# Patient Record
Sex: Male | Born: 1935 | Race: White | Hispanic: No | Marital: Married | Smoking: Never smoker
Health system: Southern US, Community
[De-identification: ages and names within clinical notes are randomized; demographics above are authoritative.]

## PROBLEM LIST (undated history)

## (undated) DIAGNOSIS — E119 Type 2 diabetes mellitus without complications: Secondary | ICD-10-CM

## (undated) DIAGNOSIS — N200 Calculus of kidney: Secondary | ICD-10-CM

## (undated) HISTORY — PX: TONSILLECTOMY: SUR1361

## (undated) HISTORY — DX: Calculus of kidney: N20.0

---

## 2004-03-24 ENCOUNTER — Emergency Department (HOSPITAL_COMMUNITY): Admission: EM | Admit: 2004-03-24 | Discharge: 2004-03-24 | Payer: Self-pay | Admitting: Emergency Medicine

## 2005-08-06 ENCOUNTER — Emergency Department (HOSPITAL_COMMUNITY): Admission: EM | Admit: 2005-08-06 | Discharge: 2005-08-06 | Payer: Self-pay | Admitting: Emergency Medicine

## 2005-12-07 ENCOUNTER — Inpatient Hospital Stay (HOSPITAL_COMMUNITY): Admission: EM | Admit: 2005-12-07 | Discharge: 2005-12-11 | Payer: Self-pay | Admitting: Emergency Medicine

## 2009-07-19 ENCOUNTER — Emergency Department (HOSPITAL_COMMUNITY): Admission: EM | Admit: 2009-07-19 | Discharge: 2009-07-19 | Payer: Self-pay | Admitting: Emergency Medicine

## 2009-07-21 ENCOUNTER — Emergency Department (HOSPITAL_COMMUNITY): Admission: EM | Admit: 2009-07-21 | Discharge: 2009-07-21 | Payer: Self-pay | Admitting: Emergency Medicine

## 2009-08-03 ENCOUNTER — Emergency Department (HOSPITAL_COMMUNITY): Admission: EM | Admit: 2009-08-03 | Discharge: 2009-08-04 | Payer: Self-pay | Admitting: Emergency Medicine

## 2009-08-03 ENCOUNTER — Emergency Department (HOSPITAL_COMMUNITY): Admission: EM | Admit: 2009-08-03 | Discharge: 2009-08-03 | Payer: Self-pay | Admitting: Family Medicine

## 2010-06-10 NOTE — Discharge Summary (Signed)
Isaac Haynes, Isaac Haynes             ACCOUNT NO.:  [id number]   MEDICAL RECORD NO.:  [id number]          PATIENT TYPE:  INP   LOCATION:  5532                         FACILITY:  [facility]   PHYSICIAN:  Corinna L. Lendell Caprice, MDDATE OF BIRTH:  1935-07-03   DATE OF ADMISSION:  12/07/2005  DATE OF DISCHARGE:  12/11/2005                                 DISCHARGE SUMMARY   DISCHARGE DIAGNOSES:  1. Syncope secondary to hypotension and dehydration.  2. Enteritis, suspect infectious.  3. Hypotension, resolved.  4. History of hypertension.  5. Bladder outlet obstruction/benign prostatic hypertrophy.  6. Acute renal insufficiency, resolved.  7. Dehydration.   DISCHARGE MEDICATIONS:  1. Hold hydrochlorothiazide, lisinopril and Lopressor for a week.  2. Stop Cardura.  3. Start Flomax 0.4 mg nightly.  4. Cipro 500 mg p.o. b.i.d. for eight more doses.  5. Flagyl 500 mg p.o. t.i.d. for 12 more doses.   DIET:  No dairy for seven days.   ACTIVITY:  Ad lib.   CONDITION:  Stable.   FOLLOW UP:  With primary care physician next week.   PERTINENT LABORATORY DATA:  Initial white blood cell count was 12,000,  otherwise unremarkable CBC.  Initial sodium 132, glucose 158, BUN 37,  creatinine 2.3, albumin 3.1 otherwise unremarkable CMET.  At discharge, his  white blood cell count of 10,000.  Discharge BUN is 16.  Discharge glucose  is 129, creatinine 1.0.  Initial CPK is 620, normal troponins, normal index.  Hemoglobin A1c 6.3, lipase normal.  Myoglobin greater than 500.  UA showed  small bilirubin, negative ketones, negative blood, negative nitrite,  negative leukocyte esterase.   SPECIAL STUDIES IN RADIOLOGY:  Acute abdominal series negative.  CT of the  abdomen and pelvis showed bilateral nephrolithiasis without obstruction.  Ill-defined cystic lesion in the left kidney most likely representing simple  cyst, but incompletely evaluated.  Inflammatory changes within the small  bowel mesentery and a  small amount of free fluid most compatible with  nonspecific enteritis.  Diverticular changes within the sigmoid colon  without any evidence of diverticulitis.  Renal ultrasound showed left renal  cyst, bilateral renal stones.   HISTORY AND HOSPITAL COURSE:  Isaac Haynes is a 75 year old prisoner who had a  syncopal episode and was found to be hypotensive.  His blood pressure in the  emergency room apparently was about 90 systolic; otherwise normal vital  signs.  He had dry mucous membranes.  He had been experiencing profuse  diarrhea, abdominal pain, vomiting, weakness.  After admission, he did drop  his blood pressure to 71.  He got IV fluids, started on empiric antibiotics.  CAT scan showed an enteritis which was presumed to be infectious.  His blood  pressure medications were held.  He was found to have renal insufficiency  which resolved with IV fluids and holding his blood pressure medications.  At the time of discharge, he had normal vital signs.  He had no change with  orthostatics.  He was tolerating a regular diet without any further diarrhea  or weakness.  He did have an episode of difficulty voiding and required a  Foley catheter for a short time.  Currently, he is on Flomax instead of  Cardura and is experiencing no problems voiding.  I would recommend holding  his antihypertensives for a week and continuing his antibiotics for four  more days.      Corinna L. Lendell Caprice, MD  Electronically Signed     CLS/MEDQ  D:  12/11/2005  T:  12/12/2005  Job:  [id number]

## 2010-06-10 NOTE — H&P (Signed)
Isaac Haynes, Isaac Haynes                ACCOUNT NO.:  [id number]   MEDICAL RECORD NO.:  [id number]          PATIENT TYPE:  INP   LOCATION:  1824                         FACILITY:  [facility]   PHYSICIAN:  Hollice Espy, M.D.DATE OF BIRTH:  April 09, 1935   DATE OF ADMISSION:  12/07/2005  DATE OF DISCHARGE:                                HISTORY & PHYSICAL   PRIMARY CARE PHYSICIAN:  None.  Of note, the patient is a prisoner in the  penial system.   CHIEF COMPLAINT:  Syncope.   HISTORY OF THE PRESENT ILLNESS:  The patient is a 75 year old incarcerated  white male with a past medical history of hypertension and BPH who tells me  that for the last few days he has been feeling rough where has been having  episodes of abdominal pain as well as nausea, vomiting and diarrhea, and  then today he felt very weak and he states that he passed out for what he  thought was several hours.  When he was discovered by the guards he was  brought into the emergency room for further evaluation.   In the emergency room he was noted to be hypotensive with a systolic blood  pressure ranging anywhere from 89 up to 99.  Labs of note were a white count  of 12.3. CPK level was greater than 500 with normal MB and troponin, and  elevated BUN and of 38 with a creatinine of 2.2, and a glucose of 158.  After finding these abnormal lab values and with the patient continuing to  remain hypotensive despite multiple doses of IV fluids it was felt best that  the patient come in for further observation.   Currently the patient is doing okay.  He denies any headaches, vision  changes, dysphagia, chest pain, palpitations, shortness of breath, wheezing,  or coughing.  When asked about his abdominal pain he describes it as  starting in the right lower quadrant, but going up to the right upper  quadrant and across his abdomen to the left upper quadrant.  He denies any  constipation.  He states his diarrhea has eased off a little and  he is  hungry.  He denies any focal extremity numbness, weakness or pain.   REVIEW OF SYSTEMS:  The review of systems is otherwise negative.   PAST MEDICAL HISTORY:  The patient's past medical history includes:  1. Hypertension.  2. BPH.   MEDICATIONS:  The medications the patient is on are:  1. Cardura 2 mg by mouth daily.  2. Lisinopril 20 by mouth daily.  3. Hydrochlorothiazide 25 by mouth daily.  4. The patient states he is on a prostate medication; I do not see one      listed.   ALLERGIES:  The patient has no known drug allergies.   SOCIAL HISTORY:  The patient used to smoke heavily, but quit a  few years  ago.  He denies any drug or alcohol abuse.  Currently he is incarcerated.   FAMILY HISTORY:  The family history is noncontributory.   PHYSICAL EXAMINATION:  VITAL SIGNS:  The patient's  vital signs on admission  are temp 97.1, heart rate 96, blood pressure 99/47, it has not improved much  and is still 93/43, respirations 28, and O2 sat  is 98% on room air.  GENERAL APPEARANCE:  In general to is alert and oriented times three, and in  no apparent distress.  HEENT:  Normocephalic and atraumatic.  His mucous membranes are dry.  NECK:  The patient has no carotid bruits.  HEART:  The heart has a regular rate and rhythm, S1 and S2.  LUNGS:  The lungs are clear to auscultation bilaterally.  ABDOMEN:  The abdomen is soft.  There is some mild tenderness, but otherwise  it is nondistended.  Positive bowel sounds.  EXTREMITIES:  The extremities show no clubbing, cyanosis or edema.   LABORATORY DATA:  The UA shows just a small amount bilirubin, otherwise  negative.  Sodium 132, potassium 4.5, chloride 99, bicarb 23, no gap, BUN  37, creatinine 2.3, and glucose 158.  Albumin is noted to be 3.1.  The rest  of his liver function tests are essentially normal.  Lipase 43, normal.  His  CPK is greater than 500 and MB normal at 4.2.  Troponin less than 0.05.  The  second set is  similar.   ASSESSMENT AND PLAN:  1. Syncope.  This looks to be secondary to a case of orthostatic      hypotension perhaps brought on by nausea, vomiting and diarrhea.  We will hold the patient's antihypertensive medication and aggressively  hydrate.  1. Abdominal pain.  The patient's description of from his right lower      quadrant to right upper to left upper quadrant possibly spans the      region of his colon, and we would like to check a computerized      tomography of the abdomen and the pelvis.  We will wait until his renal function has resumed back to normal.  In the  meantime we will continue to follow. If he does, indeed, have a colitis,  this may explain the nausea and vomiting rather than a simple viral  gastroenteritis.  We will continue to follow his white count.  If it still  remains elevated tomorrow then would consider starting the patient on  antibiotics.  1. Acute renal failure.  This is secondary to dehydration.  We will aggressively hydrate the patient.  1. Hypertension.  We hold the patient's antihypertensive medications.  1. Slightly elevated blood sugars.  We will check a hemoglobin A-1-C to be sure the patient does not also have  the diagnosis of diabetes.  1. Elevated CPK level.  He also may have a rhabdomyolysis component from      his passing out episode.  Again, we will more aggressively hydrate him and follow CPK levels.  1. Hyponatremia; likely from dehydration.      Hollice Espy, M.D.  Electronically Signed     SKK/MEDQ  D:  12/07/2005  T:  12/07/2005  Job:  [id number]
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
## 2013-01-18 ENCOUNTER — Emergency Department (HOSPITAL_COMMUNITY): Payer: Medicare Other

## 2013-01-18 ENCOUNTER — Encounter (HOSPITAL_COMMUNITY): Payer: Self-pay | Admitting: Emergency Medicine

## 2013-01-18 ENCOUNTER — Observation Stay (HOSPITAL_COMMUNITY)
Admission: EM | Admit: 2013-01-18 | Discharge: 2013-01-21 | Disposition: A | Discharge status: Home / Self Care | Payer: Medicare Other | Attending: Internal Medicine | Admitting: Internal Medicine

## 2013-01-18 DIAGNOSIS — J440 Chronic obstructive pulmonary disease with acute lower respiratory infection: Secondary | ICD-10-CM | POA: Insufficient documentation

## 2013-01-18 DIAGNOSIS — G319 Degenerative disease of nervous system, unspecified: Secondary | ICD-10-CM | POA: Insufficient documentation

## 2013-01-18 DIAGNOSIS — I498 Other specified cardiac arrhythmias: Secondary | ICD-10-CM | POA: Insufficient documentation

## 2013-01-18 DIAGNOSIS — Z9181 History of falling: Secondary | ICD-10-CM | POA: Insufficient documentation

## 2013-01-18 DIAGNOSIS — M5380 Other specified dorsopathies, site unspecified: Secondary | ICD-10-CM | POA: Insufficient documentation

## 2013-01-18 DIAGNOSIS — J209 Acute bronchitis, unspecified: Secondary | ICD-10-CM | POA: Insufficient documentation

## 2013-01-18 DIAGNOSIS — J9620 Acute and chronic respiratory failure, unspecified whether with hypoxia or hypercapnia: Principal | ICD-10-CM | POA: Insufficient documentation

## 2013-01-18 DIAGNOSIS — E119 Type 2 diabetes mellitus without complications: Secondary | ICD-10-CM | POA: Insufficient documentation

## 2013-01-18 LAB — POCT I-STAT TROPONIN I: Troponin i, poc: 0.01 ng/mL (ref 0.00–0.08)

## 2013-01-18 LAB — CBC WITH DIFFERENTIAL/PLATELET
Basophils Absolute: 0 10*3/uL (ref 0.0–0.1)
Basophils Relative: 1 % (ref 0–1)
Eosinophils Absolute: 0 10*3/uL (ref 0.0–0.7)
HCT: 39.1 % (ref 39.0–52.0)
Hemoglobin: 13.5 g/dL (ref 13.0–17.0)
MCH: 30.8 pg (ref 26.0–34.0)
MCHC: 34.5 g/dL (ref 30.0–36.0)
Monocytes Absolute: 1.1 10*3/uL — ABNORMAL HIGH (ref 0.1–1.0)
Monocytes Relative: 16 % — ABNORMAL HIGH (ref 3–12)
Neutrophils Relative %: 67 % (ref 43–77)
RDW: 12.8 % (ref 11.5–15.5)

## 2013-01-18 LAB — COMPREHENSIVE METABOLIC PANEL
AST: 96 U/L — ABNORMAL HIGH (ref 0–37)
Albumin: 3.6 g/dL (ref 3.5–5.2)
BUN: 14 mg/dL (ref 6–23)
Creatinine, Ser: 0.9 mg/dL (ref 0.50–1.35)
Total Protein: 7.1 g/dL (ref 6.0–8.3)

## 2013-01-18 LAB — GLUCOSE, CAPILLARY: Glucose-Capillary: 340 mg/dL — ABNORMAL HIGH (ref 70–99)

## 2013-01-18 MED ORDER — DEXTROSE 5 % IV SOLN
500.0000 mg | Freq: Once | INTRAVENOUS | Status: AC
  Administered 2013-01-19: 500 mg via INTRAVENOUS

## 2013-01-18 MED ORDER — DEXTROSE 5 % IV SOLN
1.0000 g | Freq: Once | INTRAVENOUS | Status: AC
  Administered 2013-01-18: 1 g via INTRAVENOUS
  Filled 2013-01-18: qty 10

## 2013-01-18 NOTE — ED Notes (Addendum)
Per EMS: Pt reports having a fever 100.7, non-productive cough for two weeks, hyperglycemia of 305, and weakness with an abnormal gait for one month. Pt reports having difficulty with his balance.  Pt denies history of hyperglycemia, however has not been medically evaluated in "years and years." Pt reports falling today after his "legs buckled" and not having the strength to get back up. EMS reports lung sounds are clear. Pt was given 1000 mg of tylenol in route.

## 2013-01-18 NOTE — ED Provider Notes (Signed)
CSN: [id number]     Arrival date & time 01/18/13  1900 History   First MD Initiated Contact with Patient 01/18/13 1926     Chief Complaint  Patient presents with  . Fever  . Weakness  . Hyperglycemia   (Consider location/radiation/quality/duration/timing/severity/associated sxs/prior Treatment) HPI Comments: Patient is 77 year old gentleman who has not received medical care for the past 6 years because "there was nothing wrong with me".  He presents to the ED after his neighbor went to see him today and he lost his balance and his "leg buckled" and he fell into the door frame striking his right forehead.  He reports no LOC, but states dizziness for the past month.  He states that his neighbor called 911 and they checked his blood sugar as well and noted it to be 305 which is new (though his son reports that he previously told him he was supposed to be taking medication for his blood sugars but was not).  He reports generalized weakness, without focal deficit, reports fever, chills, non-productive cough, and mild shortness of breath with moving around.  He denies chest pain or tightness, nausea, vomiting, abdominal pain, swelling in his legs, calf pain.  Patient is a 77 y.o. male presenting with fever, weakness, and hyperglycemia. The history is provided by the patient. No language interpreter was used.  Fever Max temp prior to arrival:  Unknown Temp source:  Subjective Severity:  Moderate Onset quality:  Gradual Duration:  2 days Timing:  Constant Progression:  Worsening Chronicity:  New Relieved by:  Nothing Worsened by:  Nothing tried Ineffective treatments:  None tried Associated symptoms: chills, congestion and cough   Associated symptoms: no chest pain, no confusion, no diarrhea, no dysuria, no ear pain, no headaches, no myalgias, no nausea, no rash, no rhinorrhea, no somnolence, no sore throat and no vomiting   Weakness Associated symptoms include chills, congestion, coughing, a  fever and weakness. Pertinent negatives include no chest pain, headaches, myalgias, nausea, rash, sore throat or vomiting.  Hyperglycemia Associated symptoms: fever   Associated symptoms: no chest pain, no confusion, no dysuria, no nausea and no vomiting     History reviewed. No pertinent past medical history. History reviewed. No pertinent past surgical history. No family history on file. History  Substance Use Topics  . Smoking status: Not on file  . Smokeless tobacco: Not on file  . Alcohol Use: Not on file    Review of Systems  Constitutional: Positive for fever and chills.  HENT: Positive for congestion. Negative for ear pain, rhinorrhea and sore throat.   Respiratory: Positive for cough.   Cardiovascular: Negative for chest pain.  Gastrointestinal: Negative for nausea, vomiting and diarrhea.  Genitourinary: Negative for dysuria.  Musculoskeletal: Negative for myalgias.  Skin: Negative for rash.  Neurological: Positive for weakness. Negative for headaches.  Psychiatric/Behavioral: Negative for confusion.  All other systems reviewed and are negative.    Allergies  Review of patient's allergies indicates no known allergies.  Home Medications   Current Outpatient Rx  Name  Route  Sig  Dispense  Refill  . Chlorphen-Pseudoephed-APAP (CORICIDIN D PO)   Oral   Take 2 tablets by mouth daily as needed (cold).          BP 143/68  Pulse 118  Temp(Src) 98.4 F (36.9 C) (Oral)  Resp 16  SpO2 94% Physical Exam  Nursing note and vitals reviewed. Constitutional: He is oriented to person, place, and time. He appears well-developed and well-nourished.  No distress.  HENT:  Head: Normocephalic and atraumatic. Head is without abrasion and without contusion.    Right Ear: External ear normal.  Left Ear: External ear normal.  Nose: Nose normal.  Mouth/Throat: Oropharynx is clear and moist. No oropharyngeal exudate.  Mild tenderness to palpation right forehead without  hematoma noted  Eyes: Conjunctivae are normal. Pupils are equal, round, and reactive to light. No scleral icterus.  Neck: Normal range of motion. Neck supple. No JVD present. No spinous process tenderness and no muscular tenderness present.  Cardiovascular: Normal rate, regular rhythm and normal heart sounds.  Exam reveals no gallop and no friction rub.   No murmur heard. Pulmonary/Chest: Effort normal and breath sounds normal. No respiratory distress. He has no wheezes. He has no rales. He exhibits no tenderness.  Abdominal: Soft. Bowel sounds are normal. He exhibits no distension. There is no tenderness. There is no rebound and no guarding.  Musculoskeletal: Normal range of motion. He exhibits no edema and no tenderness.  Lymphadenopathy:    He has no cervical adenopathy.  Neurological: He is alert and oriented to person, place, and time. He has normal reflexes. He exhibits normal muscle tone. Coordination normal.  Skin: Skin is warm and dry. No rash noted. No erythema. No pallor.  Psychiatric: He has a normal mood and affect. His behavior is normal. Judgment and thought content normal.    ED Course  Procedures (including critical care time) Labs Review Labs Reviewed  CBC WITH DIFFERENTIAL - Abnormal; Notable for the following:    Monocytes Relative 16 (*)    Monocytes Absolute 1.1 (*)    All other components within normal limits  COMPREHENSIVE METABOLIC PANEL - Abnormal; Notable for the following:    Sodium 132 (*)    Chloride 94 (*)    Glucose, Bld 349 (*)    AST 96 (*)    ALT 81 (*)    GFR calc non Af Amer 81 (*)    All other components within normal limits  GLUCOSE, CAPILLARY - Abnormal; Notable for the following:    Glucose-Capillary 340 (*)    All other components within normal limits  POCT I-STAT TROPONIN I   Imaging Review Dg Chest 2 View  01/18/2013   CLINICAL DATA:  Fever. Generalized weakness. Cough. Chest congestion. Hyperglycemia.  EXAM: CHEST  2 VIEW   COMPARISON:  Portable examination 12/07/2005.  FINDINGS: Cardiac silhouette upper normal in size to perhaps slightly enlarged but stable. Hilar and mediastinal contours otherwise unremarkable. Pulmonary vascularity normal. Prominent bronchovascular markings diffusely and mild to moderate central peribronchial thickening, more so than on the prior examination. Lungs otherwise clear. No localized airspace consolidation. No pleural effusions. No pneumothorax. Normal pulmonary vascularity. Degenerative changes involving the mid and lower thoracic spine.  IMPRESSION: Mild to moderate changes of acute bronchitis superimposed upon COPD without localized airspace pneumonia.   Electronically Signed   By: Hulan Saas M.D.   On: 01/18/2013 20:43   Ct Head Wo Contrast  01/18/2013   CLINICAL DATA:  Fever.  Weakness.  Hyperglycemia.  EXAM: CT HEAD WITHOUT CONTRAST  CT CERVICAL SPINE WITHOUT CONTRAST  TECHNIQUE: Multidetector CT imaging of the head and cervical spine was performed following the standard protocol without intravenous contrast. Multiplanar CT image reconstructions of the cervical spine were also generated.  COMPARISON:  08/03/2009  FINDINGS: CT HEAD FINDINGS  Ventricles are normal in configuration. There is ventricular and sulcal enlargement reflecting moderate atrophy. No parenchymal masses or mass effect. There is patchy  white matter hypoattenuation most consistent with mild to moderate chronic microvascular ischemic change. There are 2 small foci of hypoattenuation in the deep right parietal lobe white matter that are likely old lacunar infarcts. There is no evidence of a recent cortical infarct.  There are no extra-axial masses or abnormal fluid collections.  There is no intracranial hemorrhage.  The visualized sinuses and mastoid air cells are clear.  CT CERVICAL SPINE FINDINGS  No fracture. No spondylolisthesis. There is moderate loss of disk height at C4-C5, C5-C6 and C6-C7. There is endplate  spurring. Mild facet degenerative changes noted most prominently on the right at C4-C5. The bones are demineralized.  Thyroid gland is mildly heterogeneous consistent with multiple ill-defined nodules. There are carotid vascular calcifications. The soft tissues are otherwise unremarkable. The lung apices are clear.  IMPRESSION: HEAD CT:  No acute intracranial abnormalities.  CERVICAL CT:  No fracture or acute finding.   Electronically Signed   By: Amie Portland M.D.   On: 01/18/2013 20:58   Ct Cervical Spine Wo Contrast  01/18/2013   CLINICAL DATA:  Fever.  Weakness.  Hyperglycemia.  EXAM: CT HEAD WITHOUT CONTRAST  CT CERVICAL SPINE WITHOUT CONTRAST  TECHNIQUE: Multidetector CT imaging of the head and cervical spine was performed following the standard protocol without intravenous contrast. Multiplanar CT image reconstructions of the cervical spine were also generated.  COMPARISON:  08/03/2009  FINDINGS: CT HEAD FINDINGS  Ventricles are normal in configuration. There is ventricular and sulcal enlargement reflecting moderate atrophy. No parenchymal masses or mass effect. There is patchy white matter hypoattenuation most consistent with mild to moderate chronic microvascular ischemic change. There are 2 small foci of hypoattenuation in the deep right parietal lobe white matter that are likely old lacunar infarcts. There is no evidence of a recent cortical infarct.  There are no extra-axial masses or abnormal fluid collections.  There is no intracranial hemorrhage.  The visualized sinuses and mastoid air cells are clear.  CT CERVICAL SPINE FINDINGS  No fracture. No spondylolisthesis. There is moderate loss of disk height at C4-C5, C5-C6 and C6-C7. There is endplate spurring. Mild facet degenerative changes noted most prominently on the right at C4-C5. The bones are demineralized.  Thyroid gland is mildly heterogeneous consistent with multiple ill-defined nodules. There are carotid vascular calcifications. The soft  tissues are otherwise unremarkable. The lung apices are clear.  IMPRESSION: HEAD CT:  No acute intracranial abnormalities.  CERVICAL CT:  No fracture or acute finding.   Electronically Signed   By: Amie Portland M.D.   On: 01/18/2013 20:58    EKG Interpretation    Date/Time:  Saturday January 18 2013 20:23:28 EST Ventricular Rate:  103 PR Interval:  166 QRS Duration: 102 QT Interval:  332 QTC Calculation: 434 R Axis:   -29 Text Interpretation:  Sinus tachycardia Borderline left axis deviation Low voltage, precordial leads Abnormal R-wave progression, early transition Consider anterior infarct Confirmed by DOCHERTY  MD, MEGAN ([id number]) on 01/18/2013 8:32:41 PM            MDM  Hyperglycemia Bronchitis  Patient with history of medical non-compliance presents to the ED with his neighbor and later his sons who express concern regarding his recent falls, dizziness, hyperglycemia, fever, and cough.  CT unremarkable, CXR without focal infiltrate but showing bronchitis over COPD, though not really hypoxic does complain of shortness of breath.  Afebrile here but also noted to be tachycardic.  Also with hyperglycemia without an acidosis.  Plan to admit for  further evaluation, diabetic teaching and stabilization of blood sugar and respiratory function.  Clinically appears to be more CAP so I have placed on zithromax and rocephin.   Izola Price Marisue Humble, PA-C 01/19/13 0124

## 2013-01-19 ENCOUNTER — Observation Stay (HOSPITAL_COMMUNITY): Payer: Medicare Other

## 2013-01-19 ENCOUNTER — Encounter (HOSPITAL_COMMUNITY): Payer: Self-pay | Admitting: *Deleted

## 2013-01-19 DIAGNOSIS — J209 Acute bronchitis, unspecified: Secondary | ICD-10-CM | POA: Diagnosis present

## 2013-01-19 DIAGNOSIS — E119 Type 2 diabetes mellitus without complications: Secondary | ICD-10-CM | POA: Diagnosis present

## 2013-01-19 LAB — BASIC METABOLIC PANEL
CO2: 26 mEq/L (ref 19–32)
Calcium: 8.7 mg/dL (ref 8.4–10.5)
Chloride: 92 mEq/L — ABNORMAL LOW (ref 96–112)
Creatinine, Ser: 0.94 mg/dL (ref 0.50–1.35)
GFR calc Af Amer: >90 mL/min (ref >90)
GFR calc non Af Amer: 80 mL/min — ABNORMAL LOW (ref >90)
Glucose, Bld: 314 mg/dL — ABNORMAL HIGH (ref 70–99)
Sodium: 133 mEq/L — ABNORMAL LOW (ref 135–145)

## 2013-01-19 LAB — GLUCOSE, CAPILLARY
Glucose-Capillary: 298 mg/dL — ABNORMAL HIGH (ref 70–99)
Glucose-Capillary: 379 mg/dL — ABNORMAL HIGH (ref 70–99)

## 2013-01-19 LAB — CBC
Hemoglobin: 13 g/dL (ref 13.0–17.0)
MCH: 31.3 pg (ref 26.0–34.0)
MCHC: 34.8 g/dL (ref 30.0–36.0)
MCV: 89.9 fL (ref 78.0–100.0)
Platelets: 177 10*3/uL (ref 150–400)
RBC: 4.16 MIL/uL — ABNORMAL LOW (ref 4.22–5.81)
WBC: 5.9 10*3/uL (ref 4.0–10.5)

## 2013-01-19 LAB — HEMOGLOBIN A1C: Mean Plasma Glucose: 280 mg/dL — ABNORMAL HIGH (ref <117)

## 2013-01-19 MED ORDER — GUAIFENESIN-DM 100-10 MG/5ML PO SYRP
5.0000 mL | ORAL_SOLUTION | ORAL | Status: DC | PRN
  Administered 2013-01-19 – 2013-01-20 (×4): 5 mL via ORAL
  Filled 2013-01-19 (×4): qty 10

## 2013-01-19 MED ORDER — ALBUTEROL SULFATE (5 MG/ML) 0.5% IN NEBU: 2.5000 mg | INHALATION_SOLUTION | RESPIRATORY_TRACT | Status: DC | PRN

## 2013-01-19 MED ORDER — ALBUTEROL SULFATE (2.5 MG/3ML) 0.083% IN NEBU
2.5000 mg | INHALATION_SOLUTION | Freq: Four times a day (QID) | RESPIRATORY_TRACT | Status: DC
  Administered 2013-01-19 – 2013-01-21 (×5): 2.5 mg via RESPIRATORY_TRACT
  Filled 2013-01-19 (×2): qty 3
  Filled 2013-01-19: qty 0.5
  Filled 2013-01-19: qty 3
  Filled 2013-01-19: qty 0.5
  Filled 2013-01-19: qty 3
  Filled 2013-01-19: qty 0.5
  Filled 2013-01-19 (×4): qty 3

## 2013-01-19 MED ORDER — ALBUTEROL SULFATE (5 MG/ML) 0.5% IN NEBU
2.5000 mg | INHALATION_SOLUTION | Freq: Once | RESPIRATORY_TRACT | Status: AC
  Administered 2013-01-19: 2.5 mg via RESPIRATORY_TRACT
  Filled 2013-01-19: qty 0.5

## 2013-01-19 MED ORDER — ALBUTEROL SULFATE (2.5 MG/3ML) 0.083% IN NEBU
2.5000 mg | INHALATION_SOLUTION | RESPIRATORY_TRACT | Status: DC | PRN
  Filled 2013-01-19: qty 3

## 2013-01-19 MED ORDER — SODIUM CHLORIDE 0.9 % IV SOLN
INTRAVENOUS | Status: DC
  Administered 2013-01-19: 01:00:00 via INTRAVENOUS

## 2013-01-19 MED ORDER — INSULIN ASPART 100 UNIT/ML ~~LOC~~ SOLN
0.0000 [IU] | Freq: Three times a day (TID) | SUBCUTANEOUS | Status: DC
  Administered 2013-01-19: 20 [IU] via SUBCUTANEOUS
  Administered 2013-01-20: 11 [IU] via SUBCUTANEOUS
  Administered 2013-01-20 (×2): 15 [IU] via SUBCUTANEOUS
  Administered 2013-01-21: 11 [IU] via SUBCUTANEOUS
  Administered 2013-01-21: 8 [IU] via SUBCUTANEOUS

## 2013-01-19 MED ORDER — ACETAMINOPHEN 325 MG PO TABS
650.0000 mg | ORAL_TABLET | Freq: Four times a day (QID) | ORAL | Status: DC | PRN
  Administered 2013-01-19 – 2013-01-20 (×3): 650 mg via ORAL
  Filled 2013-01-19 (×3): qty 2

## 2013-01-19 MED ORDER — AZITHROMYCIN 500 MG PO TABS
500.0000 mg | ORAL_TABLET | Freq: Every day | ORAL | Status: DC
  Administered 2013-01-19 – 2013-01-21 (×3): 500 mg via ORAL
  Filled 2013-01-19 (×3): qty 1

## 2013-01-19 MED ORDER — INSULIN ASPART 100 UNIT/ML ~~LOC~~ SOLN
0.0000 [IU] | Freq: Three times a day (TID) | SUBCUTANEOUS | Status: DC
  Administered 2013-01-19: 15 [IU] via SUBCUTANEOUS
  Administered 2013-01-19: 8 [IU] via SUBCUTANEOUS

## 2013-01-19 MED ORDER — INSULIN GLARGINE 100 UNIT/ML ~~LOC~~ SOLN
15.0000 [IU] | Freq: Every day | SUBCUTANEOUS | Status: DC
  Filled 2013-01-19: qty 0.15

## 2013-01-19 MED ORDER — INSULIN GLARGINE 100 UNIT/ML ~~LOC~~ SOLN
5.0000 [IU] | Freq: Every day | SUBCUTANEOUS | Status: DC
  Administered 2013-01-19: 5 [IU] via SUBCUTANEOUS
  Filled 2013-01-19 (×2): qty 0.05

## 2013-01-19 MED ORDER — PREDNISONE 20 MG PO TABS
40.0000 mg | ORAL_TABLET | Freq: Every day | ORAL | Status: DC
  Administered 2013-01-19 – 2013-01-20 (×2): 40 mg via ORAL
  Filled 2013-01-19 (×3): qty 2

## 2013-01-19 MED ORDER — HEPARIN SODIUM (PORCINE) 5000 UNIT/ML IJ SOLN
5000.0000 [IU] | Freq: Three times a day (TID) | INTRAMUSCULAR | Status: DC
  Administered 2013-01-19 – 2013-01-21 (×7): 5000 [IU] via SUBCUTANEOUS
  Filled 2013-01-19 (×10): qty 1

## 2013-01-19 MED ORDER — INSULIN ASPART 100 UNIT/ML ~~LOC~~ SOLN
6.0000 [IU] | Freq: Three times a day (TID) | SUBCUTANEOUS | Status: DC
  Administered 2013-01-19 – 2013-01-21 (×6): 6 [IU] via SUBCUTANEOUS

## 2013-01-19 MED ORDER — INSULIN ASPART 100 UNIT/ML ~~LOC~~ SOLN
0.0000 [IU] | Freq: Every day | SUBCUTANEOUS | Status: DC
  Administered 2013-01-19: 4 [IU] via SUBCUTANEOUS
  Administered 2013-01-20: 5 [IU] via SUBCUTANEOUS

## 2013-01-19 NOTE — Evaluation (Signed)
Physical Therapy Evaluation Patient Details Name: Rafe Mackowski MRN: [id number] DOB: 02-06-1935 Today's Date: 01/19/2013 Time: 4098-1191 PT Time Calculation (min): 28 min  PT Assessment / Plan / Recommendation History of Present Illness  77 y.o. male who presents to the ED with generalized weakness and cough.  Symptoms onset 2 weeks ago and have gotten progressively worse.  Today he reports falling and inability to stand after falling.  No focal weakness just generalized weakness.  Mild SOB, cough, subjective fever.  Has not seen a doctor in 6 years, states no PMH but there is some question of a possible PMH of DM2 (per son "supposed to be taking a blood sugar pill")  Clinical Impression  Pt admitted with weakness after falling at home. Pt currently with functional limitations due to the deficits listed below (see PT Problem List). Pt will benefit from skilled PT to increase their independence and safety with mobility to allow discharge to the venue listed below. Also recommend OT eval as pt reports 5 falls in last 2 months, with 2 of those falls being in the shower.  Recommend RW and HHPT for home environment assessment.   PT Assessment  Patient needs continued PT services    Follow Up Recommendations  Home health PT    Does the patient have the potential to tolerate intense rehabilitation      Barriers to Discharge        Equipment Recommendations  Rolling walker with 5" wheels    Recommendations for Other Services OT consult   Frequency Min 3X/week    Precautions / Restrictions Precautions Precautions: Fall Precaution Comments: Pt reports 5 falls in last 2 months with 2 of them being out of the shower.   Pertinent Vitals/Pain o2 92% on room air with gait.      Mobility  Bed Mobility Bed Mobility: Supine to Sit Supine to Sit: HOB elevated;5: Supervision;With rails Transfers Transfers: Sit to Stand;Stand to Sit Sit to Stand: 4: Min guard;From bed;With upper extremity  assist Stand to Sit: 4: Min guard;To chair/3-in-1;With upper extremity assist Details for Transfer Assistance: cues for proper hand placement Ambulation/Gait Ambulation/Gait Assistance: 4: Min guard Ambulation Distance (Feet): 150 Feet Assistive device: Rolling walker Ambulation/Gait Assistance Details: amb with RW on room air with sats 92% and HR 103 Gait Pattern: Step-through pattern    Exercises     PT Diagnosis: Difficulty walking  PT Problem List: Decreased activity tolerance;Decreased mobility PT Treatment Interventions: Gait training;Stair training;Functional mobility training;Therapeutic activities;Therapeutic exercise;DME instruction     PT Goals(Current goals can be found in the care plan section) Acute Rehab PT Goals Patient Stated Goal: To not fall anymore PT Goal Formulation: With patient Time For Goal Achievement: 02/02/13 Potential to Achieve Goals: Good  Visit Information  Last PT Received On: 01/19/13 Assistance Needed: +1 History of Present Illness: 77 y.o. male who presents to the ED with generalized weakness and cough.  Symptoms onset 2 weeks ago and have gotten progressively worse.  Today he reports falling and inability to stand after falling.  No focal weakness just generalized weakness.  Mild SOB, cough, subjective fever.  Has not seen a doctor in 6 years, states no PMH but there is some question of a possible PMH of DM2 (per son "supposed to be taking a blood sugar pill")       Prior Functioning  Home Living Family/patient expects to be discharged to:: Private residence Living Arrangements: Alone Available Help at Discharge: Friend(s);Neighbor;Family Type of Home: Apartment Home Access: Stairs  to enter Entrance Stairs-Number of Steps: 4 (2 a sidewalk and then 2 more) Entrance Stairs-Rails: Right Home Layout: One level Home Equipment: Cane - single point Prior Function Level of Independence: Independent Communication Communication: No difficulties     Cognition  Cognition Arousal/Alertness: Awake/alert Behavior During Therapy: WFL for tasks assessed/performed Overall Cognitive Status: Within Functional Limits for tasks assessed    Extremity/Trunk Assessment Upper Extremity Assessment Upper Extremity Assessment: Overall WFL for tasks assessed Lower Extremity Assessment Lower Extremity Assessment: Overall WFL for tasks assessed Cervical / Trunk Assessment Cervical / Trunk Assessment: Normal   Balance    End of Session PT - End of Session Equipment Utilized During Treatment: Gait belt Activity Tolerance: Patient tolerated treatment well Patient left: in chair;with call bell/phone within reach Nurse Communication: Other (comment) (o2 sats with gait)  GP Functional Limitation: Mobility: Walking and moving around Mobility: Walking and Moving Around Current Status (J1914): At least 1 percent but less than 20 percent impaired, limited or restricted Mobility: Walking and Moving Around Goal Status [phone]): 0 percent impaired, limited or restricted   Sirius Woodford LUBECK 01/19/2013, 10:21 AM

## 2013-01-19 NOTE — ED Provider Notes (Signed)
Medical screening examination/treatment/procedure(s) were performed by non-physician practitioner and as supervising physician I was immediately available for consultation/collaboration.  EKG Interpretation    Date/Time:  Saturday January 18 2013 20:23:28 EST Ventricular Rate:  103 PR Interval:  166 QRS Duration: 102 QT Interval:  332 QTC Calculation: 434 R Axis:   -29 Text Interpretation:  Sinus tachycardia Borderline left axis deviation Low voltage, precordial leads Abnormal R-wave progression, early transition Consider anterior infarct Confirmed by Navie Lamoreaux  MD, Marciana Uplinger ([id number]) on 01/18/2013 8:32:41 PM              Shanna Cisco, MD 01/19/13 1229

## 2013-01-19 NOTE — Progress Notes (Signed)
Utilization Review completed.  

## 2013-01-19 NOTE — Progress Notes (Signed)
RT paged MD due to adult wheeze protocol order.  PT does not have any history of asthma.  RT explained this to MD on phone.  PT is here for Acute bronchitis per MD when MD responded to page.  MD stated that he would order treatments for patient.

## 2013-01-19 NOTE — H&P (Signed)
[facility] History and Physical  Jermanie Minshall AOZ:[id number] DOB: 1935/05/08 DOA: 01/18/2013  Referring physician: EDP PCP: Default, Provider, MD   Chief Complaint: Generalized weakness, cough   HPI: Isaac Haynes is a 77 y.o. male who presents to the ED with generalized weakness and cough.  Symptoms onset 2 weeks ago and have gotten progressively worse.  Today he reports falling and inability to stand after falling.  No focal weakness just generalized weakness.  Mild SOB, cough, subjective fever.  Has not seen a doctor in 6 years, states no PMH but there is some question of a possible PMH of DM2 (per son "supposed to be taking a blood sugar pill")  Review of Systems: Systems reviewed.  As above, otherwise negative  History reviewed. No pertinent past medical history. History reviewed. No pertinent past surgical history. Social History:  has no tobacco, alcohol, and drug history on file.  No Known Allergies  No family history on file.   Prior to Admission medications   Medication Sig Start Date End Date Taking? Authorizing Provider  Chlorphen-Pseudoephed-APAP (CORICIDIN D PO) Take 2 tablets by mouth daily as needed (cold).   Yes Historical Provider, MD   Physical Exam: Filed Vitals:   01/18/13 1903  BP: 143/68  Pulse: 118  Temp: 98.4 F (36.9 C)  Resp: 16    BP 143/68  Pulse 118  Temp(Src) 98.4 F (36.9 C) (Oral)  Resp 16  SpO2 94%  General Appearance:    Alert, oriented, no distress, appears stated age  Head:    Normocephalic, atraumatic  Eyes:    PERRL, EOMI, sclera non-icteric        Nose:   Nares without drainage or epistaxis. Mucosa, turbinates normal  Throat:   Moist mucous membranes. Oropharynx without erythema or exudate.  Neck:   Supple. No carotid bruits.  No thyromegaly.  No lymphadenopathy.   Back:     No CVA tenderness, no spinal tenderness  Lungs:     Wheezes bilaterally  Chest wall:    No tenderness to palpitation  Heart:    Regular rate and  rhythm without murmurs, gallops, rubs  Abdomen:     Soft, non-tender, nondistended, normal bowel sounds, no organomegaly  Genitalia:    deferred  Rectal:    deferred  Extremities:   No clubbing, cyanosis or edema.  Pulses:   2+ and symmetric all extremities  Skin:   Skin color, texture, turgor normal, no rashes or lesions  Lymph nodes:   Cervical, supraclavicular, and axillary nodes normal  Neurologic:   CNII-XII intact. Normal strength, sensation and reflexes      throughout    Labs on Admission:  Basic Metabolic Panel:  Recent Labs Lab 01/18/13 2010  NA 132*  K 4.1  CL 94*  CO2 23  GLUCOSE 349*  BUN 14  CREATININE 0.90  CALCIUM 9.3   Liver Function Tests:  Recent Labs Lab 01/18/13 2010  AST 96*  ALT 81*  ALKPHOS 73  BILITOT 0.3  PROT 7.1  ALBUMIN 3.6   No results found for this basename: LIPASE, AMYLASE,  in the last 168 hours No results found for this basename: AMMONIA,  in the last 168 hours CBC:  Recent Labs Lab 01/18/13 2010  WBC 6.6  NEUTROABS 4.4  HGB 13.5  HCT 39.1  MCV 89.3  PLT 166   Cardiac Enzymes: No results found for this basename: CKTOTAL, CKMB, CKMBINDEX, TROPONINI,  in the last 168 hours  BNP (last 3 results) No results  found for this basename: PROBNP,  in the last 8760 hours CBG:  Recent Labs Lab 01/18/13 2012  GLUCAP 340*    Radiological Exams on Admission: Dg Chest 2 View  01/18/2013   CLINICAL DATA:  Fever. Generalized weakness. Cough. Chest congestion. Hyperglycemia.  EXAM: CHEST  2 VIEW  COMPARISON:  Portable examination 12/07/2005.  FINDINGS: Cardiac silhouette upper normal in size to perhaps slightly enlarged but stable. Hilar and mediastinal contours otherwise unremarkable. Pulmonary vascularity normal. Prominent bronchovascular markings diffusely and mild to moderate central peribronchial thickening, more so than on the prior examination. Lungs otherwise clear. No localized airspace consolidation. No pleural effusions.  No pneumothorax. Normal pulmonary vascularity. Degenerative changes involving the mid and lower thoracic spine.  IMPRESSION: Mild to moderate changes of acute bronchitis superimposed upon COPD without localized airspace pneumonia.   Electronically Signed   By: Hulan Saas M.D.   On: 01/18/2013 20:43   Ct Head Wo Contrast  01/18/2013   CLINICAL DATA:  Fever.  Weakness.  Hyperglycemia.  EXAM: CT HEAD WITHOUT CONTRAST  CT CERVICAL SPINE WITHOUT CONTRAST  TECHNIQUE: Multidetector CT imaging of the head and cervical spine was performed following the standard protocol without intravenous contrast. Multiplanar CT image reconstructions of the cervical spine were also generated.  COMPARISON:  08/03/2009  FINDINGS: CT HEAD FINDINGS  Ventricles are normal in configuration. There is ventricular and sulcal enlargement reflecting moderate atrophy. No parenchymal masses or mass effect. There is patchy white matter hypoattenuation most consistent with mild to moderate chronic microvascular ischemic change. There are 2 small foci of hypoattenuation in the deep right parietal lobe white matter that are likely old lacunar infarcts. There is no evidence of a recent cortical infarct.  There are no extra-axial masses or abnormal fluid collections.  There is no intracranial hemorrhage.  The visualized sinuses and mastoid air cells are clear.  CT CERVICAL SPINE FINDINGS  No fracture. No spondylolisthesis. There is moderate loss of disk height at C4-C5, C5-C6 and C6-C7. There is endplate spurring. Mild facet degenerative changes noted most prominently on the right at C4-C5. The bones are demineralized.  Thyroid gland is mildly heterogeneous consistent with multiple ill-defined nodules. There are carotid vascular calcifications. The soft tissues are otherwise unremarkable. The lung apices are clear.  IMPRESSION: HEAD CT:  No acute intracranial abnormalities.  CERVICAL CT:  No fracture or acute finding.   Electronically Signed   By:  Amie Portland M.D.   On: 01/18/2013 20:58   Ct Cervical Spine Wo Contrast  01/18/2013   CLINICAL DATA:  Fever.  Weakness.  Hyperglycemia.  EXAM: CT HEAD WITHOUT CONTRAST  CT CERVICAL SPINE WITHOUT CONTRAST  TECHNIQUE: Multidetector CT imaging of the head and cervical spine was performed following the standard protocol without intravenous contrast. Multiplanar CT image reconstructions of the cervical spine were also generated.  COMPARISON:  08/03/2009  FINDINGS: CT HEAD FINDINGS  Ventricles are normal in configuration. There is ventricular and sulcal enlargement reflecting moderate atrophy. No parenchymal masses or mass effect. There is patchy white matter hypoattenuation most consistent with mild to moderate chronic microvascular ischemic change. There are 2 small foci of hypoattenuation in the deep right parietal lobe white matter that are likely old lacunar infarcts. There is no evidence of a recent cortical infarct.  There are no extra-axial masses or abnormal fluid collections.  There is no intracranial hemorrhage.  The visualized sinuses and mastoid air cells are clear.  CT CERVICAL SPINE FINDINGS  No fracture. No spondylolisthesis. There is moderate  loss of disk height at C4-C5, C5-C6 and C6-C7. There is endplate spurring. Mild facet degenerative changes noted most prominently on the right at C4-C5. The bones are demineralized.  Thyroid gland is mildly heterogeneous consistent with multiple ill-defined nodules. There are carotid vascular calcifications. The soft tissues are otherwise unremarkable. The lung apices are clear.  IMPRESSION: HEAD CT:  No acute intracranial abnormalities.  CERVICAL CT:  No fracture or acute finding.   Electronically Signed   By: Amie Portland M.D.   On: 01/18/2013 20:58    EKG: Independently reviewed.  Assessment/Plan Active Problems:   Acute bronchitis   DM2 (diabetes mellitus, type 2)   1. Acute bronchitis - azithromycin, IVF, prednisone, admitting for observation  given the generalized weakness associated with this, repeat CXR in AM to see if a pneumonia is apparent after hydration.  PT eval for weakness 2. DM2 - putting patient on med dose SSI at this time.    Code Status: Full  Family Communication: No family in room Disposition Plan: Admit to obs   Time spent: 50 min  GARDNER, JARED M. [facility] Pager [phone]  If 7AM-7PM, please contact the day team taking care of the patient [email or web address] Password [facility] 01/19/2013, 12:38 AM

## 2013-01-19 NOTE — Progress Notes (Signed)
Pt seen and examined at bedside. He is hemodynamically stable. Maintaining oxygen saturations at target range. Agree with continuing empiric ABX, Prednisone and BD's scheduled and as needed. CBG elevated due to steroid. Will place temporarily in Lantus and SSI with meal coverage.   Debbora Presto, MD  [facility] Pager [phone]  If 7PM-7AM, please contact night-coverage [email or web address] Password [email or web address]

## 2013-01-20 LAB — BASIC METABOLIC PANEL
BUN: 21 mg/dL (ref 6–23)
CO2: 25 mEq/L (ref 19–32)
Calcium: 9 mg/dL (ref 8.4–10.5)
Chloride: 97 mEq/L (ref 96–112)
Creatinine, Ser: 0.91 mg/dL (ref 0.50–1.35)
GFR calc Af Amer: >90 mL/min (ref >90)
Glucose, Bld: 286 mg/dL — ABNORMAL HIGH (ref 70–99)
Potassium: 3.7 mEq/L (ref 3.5–5.1)

## 2013-01-20 LAB — GLUCOSE, CAPILLARY
Glucose-Capillary: 281 mg/dL — ABNORMAL HIGH (ref 70–99)
Glucose-Capillary: 302 mg/dL — ABNORMAL HIGH (ref 70–99)
Glucose-Capillary: 309 mg/dL — ABNORMAL HIGH (ref 70–99)
Glucose-Capillary: 354 mg/dL — ABNORMAL HIGH (ref 70–99)
Glucose-Capillary: 359 mg/dL — ABNORMAL HIGH (ref 70–99)
Glucose-Capillary: 406 mg/dL — ABNORMAL HIGH (ref 70–99)

## 2013-01-20 LAB — CBC
HCT: 36.2 % — ABNORMAL LOW (ref 39.0–52.0)
Hemoglobin: 12.5 g/dL — ABNORMAL LOW (ref 13.0–17.0)
MCH: 30.9 pg (ref 26.0–34.0)
Platelets: 171 10*3/uL (ref 150–400)
RBC: 4.05 MIL/uL — ABNORMAL LOW (ref 4.22–5.81)
RDW: 12.9 % (ref 11.5–15.5)
WBC: 5.5 10*3/uL (ref 4.0–10.5)

## 2013-01-20 MED ORDER — INSULIN GLARGINE 100 UNIT/ML ~~LOC~~ SOLN
18.0000 [IU] | Freq: Every day | SUBCUTANEOUS | Status: DC
  Administered 2013-01-20: 18 [IU] via SUBCUTANEOUS
  Filled 2013-01-20 (×2): qty 0.18

## 2013-01-20 MED ORDER — AZITHROMYCIN 250 MG PO TABS: 500.0000 mg | ORAL_TABLET | Freq: Every day | ORAL | Status: DC

## 2013-01-20 MED ORDER — PREDNISONE 50 MG PO TABS: 40.0000 mg | ORAL_TABLET | Freq: Every day | ORAL | Status: DC

## 2013-01-20 MED ORDER — INSULIN GLARGINE 100 UNIT/ML ~~LOC~~ SOLN
15.0000 [IU] | Freq: Every day | SUBCUTANEOUS | Status: DC
  Filled 2013-01-20: qty 0.15

## 2013-01-20 MED ORDER — ALBUTEROL SULFATE HFA 108 (90 BASE) MCG/ACT IN AERS: 2.0000 | INHALATION_SPRAY | Freq: Four times a day (QID) | RESPIRATORY_TRACT | Status: DC | PRN

## 2013-01-20 MED ORDER — ALBUTEROL SULFATE (5 MG/ML) 0.5% IN NEBU: 2.5000 mg | INHALATION_SOLUTION | RESPIRATORY_TRACT | Status: DC | PRN

## 2013-01-20 MED ORDER — METHYLPREDNISOLONE SODIUM SUCC 40 MG IJ SOLR
40.0000 mg | Freq: Two times a day (BID) | INTRAMUSCULAR | Status: DC
  Administered 2013-01-20 – 2013-01-21 (×2): 40 mg via INTRAVENOUS
  Filled 2013-01-20 (×4): qty 1

## 2013-01-20 MED ORDER — LIVING WELL WITH DIABETES BOOK
Freq: Once | Status: AC
  Administered 2013-01-20: 14:00:00
  Filled 2013-01-20: qty 1

## 2013-01-20 MED ORDER — INSULIN ASPART 100 UNIT/ML ~~LOC~~ SOLN
5.0000 [IU] | Freq: Once | SUBCUTANEOUS | Status: AC
  Administered 2013-01-20: 5 [IU] via SUBCUTANEOUS

## 2013-01-20 NOTE — Progress Notes (Signed)
Physical Therapy Treatment Patient Details Name: Isaac Haynes MRN: [id number] DOB: November 04, 1935 Today's Date: 01/20/2013 Time: 4098-1191 PT Time Calculation (min): 24 min  PT Assessment / Plan / Recommendation  History of Present Illness 77 y.o. male who presents to the ED with generalized weakness and cough.  Symptoms onset 2 weeks ago and have gotten progressively worse.  Today he reports falling and inability to stand after falling.  No focal weakness just generalized weakness.  Mild SOB, cough, subjective fever.  Has not seen a doctor in 6 years, states no PMH but there is some question of a possible PMH of DM2 (per son "supposed to be taking a blood sugar pill")   PT Comments   Pt OOB in recliner stated "Yea, I need to walk".  Amb pt w/o AD as he admits he does not use anything at home.  Amb around unit twice with one standing rest break. Progressing well and plans to D/C to home in the next day or so.   Follow Up Recommendations  Home health PT     Does the patient have the potential to tolerate intense rehabilitation     Barriers to Discharge        Equipment Recommendations  Rolling walker with 5" wheels (pt states he has one)    Recommendations for Other Services    Frequency Min 3X/week   Progress towards PT Goals Progress towards PT goals: Progressing toward goals  Plan      Precautions / Restrictions Precautions Precautions: Fall Restrictions Weight Bearing Restrictions: No    Pertinent Vitals/Pain No c/o pain    Mobility  Bed Mobility Bed Mobility: Not assessed Details for Bed Mobility Assistance: Pt OOB in recliner Transfers Transfers: Sit to Stand;Stand to Sit Sit to Stand: 5: Supervision;From chair/3-in-1 Stand to Sit: 5: Supervision;To chair/3-in-1 Details for Transfer Assistance: <25% VC's on safety and hand placement with stand to sit Ambulation/Gait Ambulation/Gait Assistance: 5: Supervision;4: Min guard Ambulation Distance (Feet): 500 Feet (around  unit twice one standing rest break) Assistive device: None Ambulation/Gait Assistance Details: Amb w/o RW this session as pt admits he does not use one.  Good alternating gait with slightly increased BOS "waddle gait" but no LOB.  Did grasp to rail and wall to steady self twice in room during turns. Gait Pattern: Step-through pattern;Wide base of support Gait velocity: WFL     PT Goals (current goals can now be found in the care plan section)    Visit Information  Last PT Received On: 01/20/13 Assistance Needed: +1 History of Present Illness: 77 y.o. male who presents to the ED with generalized weakness and cough.  Symptoms onset 2 weeks ago and have gotten progressively worse.  Today he reports falling and inability to stand after falling.  No focal weakness just generalized weakness.  Mild SOB, cough, subjective fever.  Has not seen a doctor in 6 years, states no PMH but there is some question of a possible PMH of DM2 (per son "supposed to be taking a blood sugar pill")    Subjective Data      Cognition       Balance     End of Session PT - End of Session Equipment Utilized During Treatment: Gait belt Activity Tolerance: Patient tolerated treatment well Patient left: in chair;with call bell/phone within reach   Felecia Shelling  PTA [facility]  Acute  Rehab Pager      [phone]

## 2013-01-20 NOTE — Progress Notes (Signed)
Patient ID: Isaac Haynes, male   DOB: May 11, 1935, 77 y.o.   MRN: [id number]  [facility] PROGRESS NOTE  [name] VFI:[id number] DOB: 09/26/1935 DOA: 01/18/2013 PCP: Default, Provider, MD  Brief narrative: *Pt is 77 yo male with COPD, who presented to [facility] with progressively worsening shortness of breath at rest and with exertion, associated with subjective fevers, chills, productive cough of yellow sputum. [facility] asked to admit for COPR/bronchitis management.   Active Problems:   Acute respiratory failure secondary to acute on chronic COPD/bronchitis  - pt is clinically improving - continue BD's scheduled and as needed - as pt is still wheezing will place on Solumedrol and stop prednisone  - continue empiric ABX Zithromax day #2   DM2 (diabetes mellitus, type 2) - new diagnosis, will continue Lantus 15 Units for now - A1C > 11 - add meal coverage and night time coverage - diabetic educator consultation - pt can probably be discharged home on Metformin with close follow up  Consultants:  Diabetic educator   Procedures/Studies: Dg Chest 2 View   01/18/2013   Mild to moderate changes of acute bronchitis superimposed upon COPD without localized airspace pneumonia.     Ct Cervical Spine Wo Contrast   01/18/2013  No acute intracranial abnormalities.  No fracture or acute finding.     Dg Chest Port 1 View   01/19/2013  Mild vascular congestion and borderline cardiomegaly. Mild bibasilar airspace opacities likely reflect atelectasis.   Antibiotics:  Zithromax 12/27 -->  Code Status: Full Family Communication: Pt at bedside Disposition Plan: Home when medically stable  HPI/Subjective: No events overnight.   Objective: Filed Vitals:   01/19/13 2105 01/19/13 2205 01/20/13 0615 01/20/13 0854  BP: 126/73  154/78   Pulse: 104  74   Temp: 98.7 F (37.1 C)  98 F (36.7 C)   TempSrc: Oral     Resp: 18  18   Height:      Weight:      SpO2: 95% 97% 98% 97%    Intake/Output  Summary (Last 24 hours) at 01/20/13 1106 Last data filed at 01/19/13 1400  Gross per 24 hour  Intake    240 ml  Output      0 ml  Net    240 ml    Exam:   General:  Pt is alert, follows commands appropriately, not in acute distress  Cardiovascular: Regular rate and rhythm, S1/S2, no murmurs, no rubs, no gallops  Respiratory: Clear to auscultation bilaterally, mild end expiratory wheezing   Abdomen: Soft, non tender, non distended, bowel sounds present, no guarding  Extremities: No edema, pulses DP and PT palpable bilaterally  Neuro: Grossly nonfocal  Data Reviewed: Basic Metabolic Panel:  Recent Labs Lab 01/18/13 2010 01/19/13 0535 01/20/13 0514  NA 132* 133* 133*  K 4.1 3.8 3.7  CL 94* 92* 97  CO2 23 26 25   GLUCOSE 349* 314* 286*  BUN 14 17 21   CREATININE 0.90 0.94 0.91  CALCIUM 9.3 8.7 9.0   Liver Function Tests:  Recent Labs Lab 01/18/13 2010  AST 96*  ALT 81*  ALKPHOS 73  BILITOT 0.3  PROT 7.1  ALBUMIN 3.6   CBC:  Recent Labs Lab 01/18/13 2010 01/19/13 0535 01/20/13 0514  WBC 6.6 5.9 5.5  NEUTROABS 4.4  --   --   HGB 13.5 13.0 12.5*  HCT 39.1 37.4* 36.2*  MCV 89.3 89.9 89.4  PLT 166 177 171   CBG:  Recent Labs Lab 01/19/13 0752  01/19/13 1159 01/19/13 1626 01/19/13 2123 01/20/13 0758  GLUCAP 298* 379* 352* 306* 302*     Scheduled Meds: . albuterol  2.5 mg Nebulization QID  . azithromycin  500 mg Oral Daily  . heparin  5,000 Units Subcutaneous Q8H  . insulin aspart  0-20 Units Subcutaneous TID WC  . insulin aspart  0-5 Units Subcutaneous QHS  . insulin aspart  6 Units Subcutaneous TID WC  . insulin glargine  5 Units Subcutaneous QHS  . predniSONE  40 mg Oral Q breakfast   Continuous Infusions:  Debbora Presto, MD  [facility] Pager [phone]  If 7PM-7AM, please contact night-coverage [email or web address] Password [email or web address] 01/20/2013, 11:06 AM   LOS: 2 days

## 2013-01-20 NOTE — Progress Notes (Signed)
Nutrition Education Note  RD consulted for nutrition education regarding diabetes.   Lab Results  Component Value Date   HGBA1C 11.4* 01/19/2013    RD provided "Carbohydrate Counting for People with Diabetes" handout from the Academy of Nutrition and Dietetics. Discussed different food groups and their effects on blood sugar, emphasizing carbohydrate-containing foods. Provided list of carbohydrates and recommended serving sizes of common foods.  Discussed importance of controlled and consistent carbohydrate intake throughout the day. Provided examples of ways to balance meals/snacks and encouraged intake of high-fiber, whole grain complex carbohydrates. Teach back method used.  Reviewed pt's typical dietary habits. Pt does not drink soda and consumes unsweetened drinks. Pt reports not eating 3 meals/day. Reviewed importance of eating on a regular meal schedule. Discussed portion sizes of carbohydrate containing foods. Handouts reviewed.   Expect good compliance.  Body mass index is 31.57 kg/(m^2). Pt meets criteria for class I obesity based on current BMI.  Current diet order is regular, patient is consuming approximately 100% of meals at this time. Labs and medications reviewed. No further nutrition interventions warranted at this time. RD contact information provided. If additional nutrition issues arise, please re-consult RD.  Levon Hedger MS, RD, LDN [phone] Pager [phone] After Hours Pager

## 2013-01-20 NOTE — Progress Notes (Signed)
Inpatient Diabetes Program Recommendations  AACE/ADA: New Consensus Statement on Inpatient Glycemic Control (2013)  Target Ranges:  Prepandial:   less than 140 mg/dL      Peak postprandial:   less than 180 mg/dL (1-2 hours)      Critically ill patients:  140 - 180 mg/dL     **Admitted with bronchitis/COPD.  Diagnosed with DM this admission.  A1c 11.4% (01/19/13).  **Spoke with pt about new diagnosis.  Discussed A1C results with him and explained what an A1C is, basic pathophysiology of DM Type 2, basic home care, importance of checking CBGs and maintaining good CBG control to prevent long-term and short-term complications.  Reviewed signs and symptoms of hyperglycemia and hypoglycemia.  RNs to provide ongoing basic DM education at bedside with this patient.  Have ordered educational booklet and DM videos.   **Unsure how much of the information I reviewed (about DM) that patient comprehended.  Attempted to review basic DM diet information (what foods have carbs, serving sizes, etc).  Patient appeared overwhelmed and I am not sure how well he can read.  Encouraged patient to avoid sweetened beverages (sweet tea, juice, regular soda).  Have asked RD to review carbohydrate information with patient.  Will place OP DM education referral for patient to follow up with CDE at the [facility] Nutrition and DM Management center after d/c.  Patient stated he does not drive but may be able to have son drive him to appointment.  **Since patient lives alone, it may be best to avoid insulin to avoid high risk for hypoglycemia.  I am not certain he would be able to give himself a complicated insulin regimen either.   Will follow. Ambrose Finland RN, MSN, CDE Diabetes Coordinator Inpatient Diabetes Program Team Pager: [phone] (8a-10p)

## 2013-01-20 NOTE — Care Management Note (Signed)
    Page 1 of 2   01/20/2013     10:36:24 AM   CARE MANAGEMENT NOTE 01/20/2013  Patient:  Isaac Haynes, Isaac Haynes   Account Number:  [id number]  Date Initiated:  01/20/2013  Documentation initiated by:  Lorenda Ishihara  Subjective/Objective Assessment:   77 yo male admitted with acute bronchitis. PTA lived at home alone.     Action/Plan:   Home when stable   Anticipated DC Date:  01/20/2013   Anticipated DC Plan:  HOME W HOME HEALTH SERVICES      DC Planning Services  CM consult      [facility] Choice  HOME HEALTH   Choice offered to / List presented to:  C-1 Patient   DME arranged  3-N-1  Levan Hurst      DME agency  [facility]     [facility] arranged  HH-1 RN  HH-10 DISEASE MANAGEMENT  HH-2 PT  HH-3 OT  HH-4 NURSE'S AIDE      HH agency  [facility]   Status of service:  Completed, signed off Medicare Important Message given?   (If response is "NO", the following Medicare IM given date fields will be blank) Date Medicare IM given:   Date Additional Medicare IM given:    Discharge Disposition:  HOME W HOME HEALTH SERVICES  Per UR Regulation:  Reviewed for med. necessity/level of care/duration of stay  If discussed at Long Length of Stay Meetings, dates discussed:    Comments:  01-20-13 Lorenda Ishihara RN CM 1030 Spoke with patient at bedside, is agreeable to [facility] services. No preference for ageny, offered [facility]. Patient also requesting RW and 3-in-1. Awaiting final orders.

## 2013-01-21 LAB — BASIC METABOLIC PANEL
GFR calc Af Amer: >90 mL/min (ref >90)
GFR calc non Af Amer: 80 mL/min — ABNORMAL LOW (ref >90)
Glucose, Bld: 282 mg/dL — ABNORMAL HIGH (ref 70–99)
Potassium: 4.4 mEq/L (ref 3.7–5.3)
Sodium: 133 mEq/L — ABNORMAL LOW (ref 137–147)

## 2013-01-21 LAB — CBC
HCT: 36.3 % — ABNORMAL LOW (ref 39.0–52.0)
Hemoglobin: 12.7 g/dL — ABNORMAL LOW (ref 13.0–17.0)
MCH: 30.7 pg (ref 26.0–34.0)
MCHC: 35 g/dL (ref 30.0–36.0)
RDW: 12.6 % (ref 11.5–15.5)

## 2013-01-21 MED ORDER — METFORMIN HCL 1000 MG PO TABS: 1000.0000 mg | ORAL_TABLET | Freq: Two times a day (BID) | ORAL | Status: AC

## 2013-01-21 MED ORDER — PREDNISONE 50 MG PO TABS: 40.0000 mg | ORAL_TABLET | Freq: Every day | ORAL | Status: DC

## 2013-01-21 MED ORDER — ONETOUCH ULTRA SYSTEM W/DEVICE KIT: 1.0000 | PACK | Freq: Once | Status: DC

## 2013-01-21 MED ORDER — ALBUTEROL SULFATE (5 MG/ML) 0.5% IN NEBU: 2.5000 mg | INHALATION_SOLUTION | RESPIRATORY_TRACT | Status: DC | PRN

## 2013-01-21 MED ORDER — METFORMIN HCL 1000 MG PO TABS: 1000.0000 mg | ORAL_TABLET | Freq: Two times a day (BID) | ORAL | Status: DC

## 2013-01-21 MED ORDER — AZITHROMYCIN 250 MG PO TABS: 500.0000 mg | ORAL_TABLET | Freq: Every day | ORAL | Status: DC

## 2013-01-21 MED ORDER — FUROSEMIDE 10 MG/ML IJ SOLN
40.0000 mg | Freq: Once | INTRAMUSCULAR | Status: AC
  Administered 2013-01-21: 40 mg via INTRAVENOUS
  Filled 2013-01-21 (×2): qty 4

## 2013-01-21 MED ORDER — ALBUTEROL SULFATE HFA 108 (90 BASE) MCG/ACT IN AERS: 2.0000 | INHALATION_SPRAY | Freq: Four times a day (QID) | RESPIRATORY_TRACT | Status: DC | PRN

## 2013-01-21 NOTE — Discharge Summary (Signed)
Physician Discharge Summary  Mehul Rudin UJW:[id number] DOB: February 24, 1935 DOA: 01/18/2013  PCP: Default, Provider, MD  Admit date: 01/18/2013 Discharge date: 01/21/2013  Recommendations for Outpatient Follow-up:  1. Pt will need to follow up with PCP in 2-3 weeks post discharge 2. Please obtain BMP to evaluate electrolytes and kidney function 3. Please also check CBC to evaluate Hg and Hct levels 4. Please note that pt was discharged on Zithromax and Prednisone taper pack 5. In addition, pt was diagnosed with new onset diabetes, education provided, A1C > 11 6. Pt currently not willing to administer insulin and is willing to comply with dietary recommendations and oral medication 7. Pt was started on Metformin, so please check CBG to ensure that sugars are well controlled   Discharge Diagnoses: Acute on chronic respiratory failure secondary to COPD, bronchitis  Active Problems:   Acute bronchitis   DM2 (diabetes mellitus, type 2)  Discharge Condition: Stable  Diet recommendation: Heart healthy diet discussed in details   Brief narrative:  *Pt is 77 yo male with COPD, who presented to [facility] with progressively worsening shortness of breath at rest and with exertion, associated with subjective fevers, chills, productive cough of yellow sputum. TRH asked to admit for COPR/bronchitis management.   Active Problems:  Acute respiratory failure secondary to acute on chronic COPD/bronchitis  - pt is clinically improving and says he is at his baseline and wants to go home  - continued BD's scheduled and as needed and pt has responded well  - solumedrol was provided inpatient and pt was successfully transitioned to oral Prednisone  - continue empiric ABX Zithromax day #3 and continue upon discharge  DM2 (diabetes mellitus, type 2)  - new diagnosis - A1C > 11  - diabetic educator consultation appreciated  - discharged home on Metformin with close follow up   Consultants:  Diabetic educator   Procedures/Studies:  Dg Chest 2 View 01/18/2013 Mild to moderate changes of acute bronchitis superimposed upon COPD without localized airspace pneumonia.  Ct Cervical Spine Wo Contrast 01/18/2013 No acute intracranial abnormalities. No fracture or acute finding.  Dg Chest Port 1 View 01/19/2013 Mild vascular congestion and borderline cardiomegaly. Mild bibasilar airspace opacities likely reflect atelectasis.  Antibiotics:  Zithromax 12/27 -->  Code Status: Full  Family Communication: Pt at bedside   Discharge Exam: Filed Vitals:   01/21/13 0532  BP: 123/74  Pulse: 80  Temp: 98 F (36.7 C)  Resp: 18   Filed Vitals:   01/20/13 2028 01/20/13 2205 01/21/13 0532 01/21/13 0758  BP:  130/78 123/74   Pulse: 92 102 80   Temp:  98.5 F (36.9 C) 98 F (36.7 C)   TempSrc:  Oral Oral   Resp: 18 18 18    Height:      Weight:      SpO2: 95% 95% 97% 95%    General: Pt is alert, follows commands appropriately, not in acute distress Cardiovascular: Regular rate and rhythm, S1/S2 +, no murmurs, no rubs, no gallops Respiratory: Clear to auscultation bilaterally, no wheezing, no crackles, no rhonchi Abdominal: Soft, non tender, non distended, bowel sounds +, no guarding Extremities: no edema, no cyanosis, pulses palpable bilaterally DP and PT Neuro: Grossly nonfocal  Discharge Instructions  Discharge Orders   Future Orders Complete By Expires   Ambulatory referral to Nutrition and Diabetic Education  As directed    Comments:     A1c 11.4%.  New diagnosis if DM.  Patient is home bound and does not drive.  Will likely need 1:1 session appointment in the evening if possible.  Son works until [employer].  Patient: Please call the [facility] after discharge to schedule an appointment for diabetes education if you do not hear from the center before discharge  [phone]   Diet - low sodium heart healthy  As directed    Increase activity slowly  As directed         Medication List         albuterol (5 MG/ML) 0.5% nebulizer solution  Commonly known as:  PROVENTIL  Take 0.5 mLs (2.5 mg total) by nebulization every 4 (four) hours as needed for wheezing or shortness of breath.     albuterol 108 (90 BASE) MCG/ACT inhaler  Commonly known as:  PROVENTIL HFA;VENTOLIN HFA  Inhale 2 puffs into the lungs every 6 (six) hours as needed for wheezing or shortness of breath.     azithromycin 250 MG tablet  Commonly known as:  ZITHROMAX  Take 2 tablets (500 mg total) by mouth daily.     CORICIDIN D PO  Take 2 tablets by mouth daily as needed (cold).     metFORMIN 1000 MG tablet  Commonly known as:  GLUCOPHAGE  Take 1 tablet (1,000 mg total) by mouth 2 (two) times daily with a meal.     predniSONE 50 MG tablet  Commonly known as:  DELTASONE  Take 1 tablet (50 mg total) by mouth daily with breakfast. Take 50 mg tablet today and taper down by 10 mg daily until completed           Follow-up Information   Follow up with Debbora Presto, MD. (As needed call my cell phone [phone])    Specialty:  Internal Medicine   Contact information:   [address] [place] [address] [phone]       Follow up with Default, Provider, MD.   Contact information:   [address] [place] [address] [phone]        The results of significant diagnostics from this hospitalization (including imaging, microbiology, ancillary and laboratory) are listed below for reference.     Microbiology: No results found for this or any previous visit (from the past 240 hour(s)).   Labs: Basic Metabolic Panel:  Recent Labs Lab 01/18/13 2010 01/19/13 0535 01/20/13 0514 01/21/13 0540  NA 132* 133* 133* 133*  K 4.1 3.8 3.7 4.4  CL 94* 92* 97 96  CO2 23 26 25 23   GLUCOSE 349* 314* 286* 282*  BUN 14 17 21  27*  CREATININE 0.90 0.94 0.91 0.93  CALCIUM 9.3 8.7 9.0 9.6   Liver Function Tests:  Recent Labs Lab 01/18/13 2010  AST 96*   ALT 81*  ALKPHOS 73  BILITOT 0.3  PROT 7.1  ALBUMIN 3.6   CBC:  Recent Labs Lab 01/18/13 2010 01/19/13 0535 01/20/13 0514 01/21/13 0540  WBC 6.6 5.9 5.5 6.4  NEUTROABS 4.4  --   --   --   HGB 13.5 13.0 12.5* 12.7*  HCT 39.1 37.4* 36.2* 36.3*  MCV 89.3 89.9 89.4 87.7  PLT 166 177 171 197   CBG:  Recent Labs Lab 01/20/13 1649 01/20/13 2153 01/20/13 2300 01/20/13 2353 01/21/13 0724  GLUCAP 309* 359* 406* 354* 295*     SIGNED: Time coordinating discharge: Over 30 minutes  Debbora Presto, MD  [facility] 01/21/2013, 10:51 AM Pager [phone]  If 7PM-7AM, please contact night-coverage [email or web address] Password [email or web address]

## 2013-01-21 NOTE — Progress Notes (Signed)
Repeat abnormal CBG read 354. K. Schoor, NP notified. Will continue with scheduled CBG in AM per NP.

## 2013-01-21 NOTE — Progress Notes (Signed)
Inpatient Diabetes Program Recommendations  AACE/ADA: New Consensus Statement on Inpatient Glycemic Control (2013)  Target Ranges:  Prepandial:   less than 140 mg/dL      Peak postprandial:   less than 180 mg/dL (1-2 hours)      Critically ill patients:  140 - 180 mg/dL     Patient for d/c home today.  Went in to speak with patient again today before d/c.    Discussed new medication of Metformin with patient.  Explained how to take Metformin and what the possible side effects are.  Explained to patient that he may have some GI side effects including diarrhea, loose stools, etc.  Told patient that this is common and that these side effects usually resolve within a few weeks.  Instructed patient to call his PCP if these side effects persist past three weeks.  Encouraged patient to fill his Rxs immediately upon d/c.  Instructed patient to check his CBGs at least once a day at home and to vary the time of day he checks every day.  Also encouraged patient to keep a journal of his CBGs and to take his log book with him to all his PCP appointments.  Teach back method used.   Will follow. Ambrose Finland RN, MSN, CDE Diabetes Coordinator Inpatient Diabetes Program Team Pager: [phone] (8a-10p)

## 2013-02-14 ENCOUNTER — Encounter: Payer: Medicare Other | Attending: Internal Medicine

## 2013-02-21 ENCOUNTER — Ambulatory Visit: Payer: Medicare Other

## 2013-02-28 ENCOUNTER — Ambulatory Visit: Payer: Medicare Other

## 2013-03-20 ENCOUNTER — Ambulatory Visit: Payer: Medicare Other

## 2013-03-21 ENCOUNTER — Ambulatory Visit: Payer: Medicare Other

## 2013-03-27 ENCOUNTER — Ambulatory Visit: Payer: Medicare Other

## 2013-04-03 ENCOUNTER — Ambulatory Visit: Payer: Medicare Other

## 2014-09-17 ENCOUNTER — Emergency Department (HOSPITAL_COMMUNITY): Payer: Medicare Other

## 2014-09-17 ENCOUNTER — Inpatient Hospital Stay (HOSPITAL_COMMUNITY)
Admission: EM | Admit: 2014-09-17 | Discharge: 2014-09-21 | DRG: 470 | Disposition: A | Discharge status: Skilled Nursing Facility | Payer: Medicare Other | Attending: Internal Medicine | Admitting: Internal Medicine

## 2014-09-17 ENCOUNTER — Encounter (HOSPITAL_COMMUNITY): Payer: Self-pay | Admitting: Emergency Medicine

## 2014-09-17 DIAGNOSIS — E1165 Type 2 diabetes mellitus with hyperglycemia: Secondary | ICD-10-CM | POA: Diagnosis not present

## 2014-09-17 DIAGNOSIS — Z9889 Other specified postprocedural states: Secondary | ICD-10-CM

## 2014-09-17 DIAGNOSIS — W109XXA Fall (on) (from) unspecified stairs and steps, initial encounter: Secondary | ICD-10-CM | POA: Diagnosis present

## 2014-09-17 DIAGNOSIS — M62838 Other muscle spasm: Secondary | ICD-10-CM | POA: Diagnosis not present

## 2014-09-17 DIAGNOSIS — M25551 Pain in right hip: Secondary | ICD-10-CM | POA: Diagnosis present

## 2014-09-17 DIAGNOSIS — D72829 Elevated white blood cell count, unspecified: Secondary | ICD-10-CM | POA: Diagnosis present

## 2014-09-17 DIAGNOSIS — S72001D Fracture of unspecified part of neck of right femur, subsequent encounter for closed fracture with routine healing: Secondary | ICD-10-CM | POA: Diagnosis not present

## 2014-09-17 DIAGNOSIS — H538 Other visual disturbances: Secondary | ICD-10-CM | POA: Diagnosis present

## 2014-09-17 DIAGNOSIS — E119 Type 2 diabetes mellitus without complications: Secondary | ICD-10-CM

## 2014-09-17 DIAGNOSIS — R55 Syncope and collapse: Secondary | ICD-10-CM

## 2014-09-17 DIAGNOSIS — G44019 Episodic cluster headache, not intractable: Secondary | ICD-10-CM | POA: Diagnosis not present

## 2014-09-17 DIAGNOSIS — D62 Acute posthemorrhagic anemia: Secondary | ICD-10-CM | POA: Diagnosis not present

## 2014-09-17 DIAGNOSIS — R51 Headache: Secondary | ICD-10-CM

## 2014-09-17 DIAGNOSIS — S72001A Fracture of unspecified part of neck of right femur, initial encounter for closed fracture: Principal | ICD-10-CM | POA: Diagnosis present

## 2014-09-17 DIAGNOSIS — R519 Headache, unspecified: Secondary | ICD-10-CM

## 2014-09-17 HISTORY — DX: Type 2 diabetes mellitus without complications: E11.9

## 2014-09-17 LAB — BASIC METABOLIC PANEL
ANION GAP: 12 (ref 5–15)
BUN: 16 mg/dL (ref 6–20)
CALCIUM: 9.1 mg/dL (ref 8.9–10.3)
CO2: 21 mmol/L — AB (ref 22–32)
Chloride: 104 mmol/L (ref 101–111)
Creatinine, Ser: 0.91 mg/dL (ref 0.61–1.24)
Glucose, Bld: 261 mg/dL — ABNORMAL HIGH (ref 65–99)
POTASSIUM: 3.7 mmol/L (ref 3.5–5.1)
Sodium: 137 mmol/L (ref 135–145)

## 2014-09-17 LAB — CBC WITH DIFFERENTIAL/PLATELET
BASOS ABS: 0 10*3/uL (ref 0.0–0.1)
BASOS PCT: 0 % (ref 0–1)
Eosinophils Absolute: 0.1 10*3/uL (ref 0.0–0.7)
Eosinophils Relative: 1 % (ref 0–5)
HEMATOCRIT: 39.7 % (ref 39.0–52.0)
HEMOGLOBIN: 14 g/dL (ref 13.0–17.0)
LYMPHS PCT: 13 % (ref 12–46)
Lymphs Abs: 1.7 10*3/uL (ref 0.7–4.0)
MCH: 30.6 pg (ref 26.0–34.0)
MCHC: 35.3 g/dL (ref 30.0–36.0)
MCV: 86.9 fL (ref 78.0–100.0)
MONO ABS: 0.9 10*3/uL (ref 0.1–1.0)
Monocytes Relative: 7 % (ref 3–12)
NEUTROS ABS: 10.1 10*3/uL — AB (ref 1.7–7.7)
NEUTROS PCT: 79 % — AB (ref 43–77)
Platelets: 182 10*3/uL (ref 150–400)
RBC: 4.57 MIL/uL (ref 4.22–5.81)
RDW: 12.9 % (ref 11.5–15.5)
WBC: 12.9 10*3/uL — AB (ref 4.0–10.5)

## 2014-09-17 LAB — GLUCOSE, CAPILLARY: GLUCOSE-CAPILLARY: 296 mg/dL — AB (ref 65–99)

## 2014-09-17 LAB — I-STAT TROPONIN, ED: Troponin i, poc: 0 ng/mL (ref 0.00–0.08)

## 2014-09-17 MED ORDER — ENOXAPARIN SODIUM 40 MG/0.4ML ~~LOC~~ SOLN
40.0000 mg | SUBCUTANEOUS | Status: DC
  Administered 2014-09-17 – 2014-09-20 (×4): 40 mg via SUBCUTANEOUS
  Filled 2014-09-17 (×5): qty 0.4

## 2014-09-17 MED ORDER — ACETAMINOPHEN 325 MG PO TABS: 650.0000 mg | ORAL_TABLET | Freq: Four times a day (QID) | ORAL | Status: DC | PRN

## 2014-09-17 MED ORDER — ALBUTEROL SULFATE (2.5 MG/3ML) 0.083% IN NEBU: 3.0000 mL | INHALATION_SOLUTION | Freq: Four times a day (QID) | RESPIRATORY_TRACT | Status: DC | PRN

## 2014-09-17 MED ORDER — MORPHINE SULFATE (PF) 2 MG/ML IV SOLN
1.0000 mg | INTRAVENOUS | Status: DC | PRN
  Administered 2014-09-17 – 2014-09-18 (×2): 1 mg via INTRAVENOUS
  Filled 2014-09-17 (×2): qty 1

## 2014-09-17 MED ORDER — HYDROCODONE-ACETAMINOPHEN 5-325 MG PO TABS
1.0000 | ORAL_TABLET | Freq: Four times a day (QID) | ORAL | Status: DC | PRN
  Administered 2014-09-17 – 2014-09-19 (×4): 2 via ORAL
  Administered 2014-09-19 – 2014-09-20 (×2): 1 via ORAL
  Administered 2014-09-20: 1.5 via ORAL
  Administered 2014-09-20: 1 via ORAL
  Administered 2014-09-20 – 2014-09-21 (×3): 1.5 via ORAL
  Filled 2014-09-17 (×2): qty 1
  Filled 2014-09-17 (×8): qty 2
  Filled 2014-09-17: qty 1

## 2014-09-17 MED ORDER — INSULIN ASPART 100 UNIT/ML ~~LOC~~ SOLN
0.0000 [IU] | Freq: Every day | SUBCUTANEOUS | Status: DC
  Administered 2014-09-17: 3 [IU] via SUBCUTANEOUS
  Administered 2014-09-18 – 2014-09-19 (×2): 2 [IU] via SUBCUTANEOUS
  Administered 2014-09-20: 3 [IU] via SUBCUTANEOUS

## 2014-09-17 MED ORDER — METHOCARBAMOL 1000 MG/10ML IJ SOLN
500.0000 mg | Freq: Three times a day (TID) | INTRAVENOUS | Status: DC | PRN
  Filled 2014-09-17: qty 5

## 2014-09-17 MED ORDER — ACETAMINOPHEN 650 MG RE SUPP: 650.0000 mg | Freq: Four times a day (QID) | RECTAL | Status: DC | PRN

## 2014-09-17 MED ORDER — FENTANYL CITRATE (PF) 100 MCG/2ML IJ SOLN
50.0000 ug | Freq: Once | INTRAMUSCULAR | Status: AC
  Administered 2014-09-17: 50 ug via INTRAVENOUS
  Filled 2014-09-17: qty 2

## 2014-09-17 MED ORDER — INSULIN ASPART 100 UNIT/ML ~~LOC~~ SOLN
0.0000 [IU] | Freq: Three times a day (TID) | SUBCUTANEOUS | Status: DC
  Administered 2014-09-18 (×2): 5 [IU] via SUBCUTANEOUS
  Administered 2014-09-19: 11 [IU] via SUBCUTANEOUS
  Administered 2014-09-19 (×2): 8 [IU] via SUBCUTANEOUS
  Administered 2014-09-20 (×2): 5 [IU] via SUBCUTANEOUS
  Administered 2014-09-20: 8 [IU] via SUBCUTANEOUS
  Administered 2014-09-21: 5 [IU] via SUBCUTANEOUS
  Administered 2014-09-21: 3 [IU] via SUBCUTANEOUS

## 2014-09-17 MED ORDER — SODIUM CHLORIDE 0.9 % IV SOLN
INTRAVENOUS | Status: DC
Start: 2014-09-17 — End: 2014-09-20
  Administered 2014-09-17 – 2014-09-19 (×4): via INTRAVENOUS

## 2014-09-17 MED ORDER — SODIUM CHLORIDE 0.9 % IJ SOLN
3.0000 mL | Freq: Two times a day (BID) | INTRAMUSCULAR | Status: DC
  Administered 2014-09-17 – 2014-09-20 (×3): 3 mL via INTRAVENOUS
  Administered 2014-09-20: 11:00:00 via INTRAVENOUS
  Administered 2014-09-20 – 2014-09-21 (×2): 3 mL via INTRAVENOUS

## 2014-09-17 MED ORDER — BISACODYL 5 MG PO TBEC
5.0000 mg | DELAYED_RELEASE_TABLET | Freq: Every day | ORAL | Status: DC | PRN
  Filled 2014-09-17: qty 1

## 2014-09-17 MED ORDER — ASPIRIN EC 325 MG PO TBEC
325.0000 mg | DELAYED_RELEASE_TABLET | Freq: Every day | ORAL | Status: DC | PRN
  Filled 2014-09-17: qty 1

## 2014-09-17 NOTE — ED Provider Notes (Signed)
       79 year old male signed out to me by Rhea Bleacher PA-C at time of shift change pending labs. Patient had a syncopal episode today falling on the right hip with a mildly displaced proximal right femoral neck fracture. No precipitating factors to the syncope, no history of prior fracture. Dr. Eulah Pont was consult at reported that he would see the patient tonight or first thing tomorrow morning with surgical management more afternoon. Labs showed no significant findings, hospital service was consult at and agreed to inpatient management. Patient remained stable throughout his stay here in the ED.  Eyvonne Mechanic, PA-C 09/17/14 1714  Lorre Nick, MD 09/17/14 [phone]

## 2014-09-17 NOTE — ED Notes (Signed)
Pt felt dizzy and fell at home as he was walking outside. Possible syncope? Pt complaining of right knee pain, chest pain, and hip pain. EMS reports shortening and rotation of right leg. CBG 296, BP 154/70, HR 96 sinus rhythm, resp 24

## 2014-09-17 NOTE — H&P (Signed)
[facility] History and Physical  Isaac Haynes ENI:[id number] DOB: Nov 23, 1935 DOA: 09/17/2014  Referring physician: ED PCP: Delia Chimes, NP   Chief Complaint: Fall at home  HPI:  79 year old mael with hx of DM presented from home after sustaining a fall. He was gettign out oof the house and was trying to hold the side rails infront of the door but thinks he may have missed a step and fell on the floor . He blanked out for a second before landing on the floor.  Denies passing out or hitting his head. He thinks he landed on his right side ( hip and chest and started having pain).  patient brought to the ED. Vitals were stable.  Blood work showed mild leucocytosis, normal H&h and platelets. Normal chemistry except  Blood glucose of 261. Xray of right hip showed mildly displaced proximal right femoral neck fracture.  CXR was unremarkable. EKG showed LAD which was unchanged from prior.   Pt c/o rt hip and rt lateral chest pain.  Orthopedics surgery consulted who plan on rt hip arthroplasty on 8/26 at 1:30 pm Patient denies headache, dizziness, fever, chills, nausea , vomiting, chest pain, palpitations, SOB, abdominal pain, bowel or urinary symptoms. Denies change in weight or appetite.  Review of Systems:  As outlined in HPI. 12 point ROS otherwise unremarkable.  Past Medical History  Diagnosis Date  . Diabetes mellitus without complication    History reviewed. No pertinent past surgical history. Social History:  reports that he has never smoked. He does not have any smokeless tobacco history on file. He reports that he drinks alcohol. He reports that he does not use illicit drugs.  No Known Allergies  family history  no family hx of diabetes, CAD or HTN  Prior to Admission medications   Medication Sig Start Date End Date Taking? Authorizing Provider  albuterol (PROVENTIL HFA;VENTOLIN HFA) 108 (90 BASE) MCG/ACT inhaler Inhale 2 puffs into the lungs every 6 (six) hours as  needed for wheezing or shortness of breath. 01/21/13  Yes Theodis Blaze, MD  albuterol (PROVENTIL) (5 MG/ML) 0.5% nebulizer solution Take 0.5 mLs (2.5 mg total) by nebulization every 4 (four) hours as needed for wheezing or shortness of breath. 01/21/13  Yes Theodis Blaze, MD  aspirin 325 MG EC tablet Take 325 mg by mouth daily as needed for pain.   Yes Historical Provider, MD  Blood Glucose Monitoring Suppl (ONE TOUCH ULTRA SYSTEM KIT) W/DEVICE KIT 1 kit by Does not apply route once. 01/21/13  Yes Theodis Blaze, MD  Chlorphen-Pseudoephed-APAP (CORICIDIN D PO) Take 2 tablets by mouth daily as needed (cold).   Yes Historical Provider, MD  metFORMIN (GLUCOPHAGE) 1000 MG tablet Take 1 tablet (1,000 mg total) by mouth 2 (two) times daily with a meal. 01/21/13  Yes Theodis Blaze, MD     Physical Exam:  Filed Vitals:   09/17/14 1600 09/17/14 1630 09/17/14 1645 09/17/14 1700  BP: 133/105 146/129 136/71 139/75  Pulse: 90 104 90 99  Temp:      TempSrc:      Resp: '24 25 29 23  ' Height:      Weight:      SpO2: 97% 96% 90% 96%    Constitutional: Vital signs reviewed.  Elderly male in  no acute distress. HEENT: no pallor, no icterus, moist oral mucosa, no cervical lymphadenopathy Cardiovascular: RRR, S1 normal, S2 normal, no MRG Chest: CTAB, no wheezes, rales, or rhonchi. Tenderness to pressure over  er  right chest Abdominal: Soft. Non-tender, non-distended, bowel sounds are normal, Ext: warm, rt hip shortened and externally rotated.. Limited movement. Neurological: A&O x3, non focal  Labs on Admission:  Basic Metabolic Panel:  Recent Labs Lab 09/17/14 1558  NA 137  K 3.7  CL 104  CO2 21*  GLUCOSE 261*  BUN 16  CREATININE 0.91  CALCIUM 9.1   Liver Function Tests: No results for input(s): AST, ALT, ALKPHOS, BILITOT, PROT, ALBUMIN in the last 168 hours. No results for input(s): LIPASE, AMYLASE in the last 168 hours. No results for input(s): AMMONIA in the last 168  hours. CBC:  Recent Labs Lab 09/17/14 1558  WBC 12.9*  NEUTROABS 10.1*  HGB 14.0  HCT 39.7  MCV 86.9  PLT 182   Cardiac Enzymes: No results for input(s): CKTOTAL, CKMB, CKMBINDEX, TROPONINI in the last 168 hours. BNP: Invalid input(s): POCBNP CBG: No results for input(s): GLUCAP in the last 168 hours.  Radiological Exams on Admission: Dg Chest 2 View  09/17/2014   CLINICAL DATA:  Hip pain post fall, diabetes mellitus  EXAM: CHEST  2 VIEW  COMPARISON:  01/19/2013  FINDINGS: Upper normal heart size.  Mediastinal contours and pulmonary vascularity normal.  Lungs clear.  No pleural effusion or pneumothorax.  No acute osseous findings.  IMPRESSION: No acute abnormalities.   Electronically Signed   By: Lavonia Dana M.D.   On: 09/17/2014 14:56   Dg Hip Unilat With Pelvis 2-3 Views Right  09/17/2014   CLINICAL DATA:  Acute right hip pain after fall.  Initial encounter.  EXAM: DG HIP (WITH OR WITHOUT PELVIS) 2-3V RIGHT  COMPARISON:  None.  FINDINGS: Mildly displaced proximal right femoral neck fracture is noted. This appears closed and posttraumatic. Femoral head remains position within the acetabulum.  IMPRESSION: Mildly displaced proximal right femoral neck fracture.   Electronically Signed   By: Marijo Conception, M.D.   On: 09/17/2014 14:55    EKG: NSR'@94' , LAD, No ST-T changes, no change From prior EKG  Assessment/Plan   Principal Problem:   Closed right hip fracture Appears secondary to mechanical fall. Doubt syncope.  Admit to med surg  pain control with prn vicodin and morphine. Add robaxin for muscle spasms. Add bowel regimen.  seen by ortho ( Dr Percell Miller). Plan on OR in am.  EKG reviewed. No further perioperative cardiac w/up necessary. Resume ASA. Does not need perioperative beta blockade.   Active Problems: Diabetes mellitus Hold metformin.  monitor fsg. continue SSI   Right sided chest pain  appears musculoskeletal from fall. Tender to pressure. Monitor with pain  control.  Diet:cardiac. NPO after midnight  DVT prophylaxis: sq lovenox   Code Status: full code Family Communication: None at bedside Disposition Plan: admit to med surg. Pt lives alone, will need SNF post op  Louellen Molder [facility] Pager [phone]  Total time spent on admission :45 minutes  If 7PM-7AM, please contact night-coverage [email or web address] Password [facility] 09/17/2014, 5:37 PM

## 2014-09-17 NOTE — Consult Note (Signed)
I have reviewed his films and case Tentative plan is for Right hip hemiarthroplasty tomorrow (friday) at 1:30   Formal c/s to follow   Wagner Tanzi D

## 2014-09-17 NOTE — ED Notes (Signed)
Attempted report x1. 

## 2014-09-17 NOTE — ED Provider Notes (Signed)
CSN: [id number]     Arrival date & time 09/17/14  1319 History   First MD Initiated Contact with Patient 09/17/14 1319     Chief Complaint  Patient presents with  . Fall  . Hip Pain     (Consider location/radiation/quality/duration/timing/severity/associated sxs/prior Treatment) HPI Comments: Patient with history of diabetes presents with complaint of syncopal episode and right hip pain that began acutely just prior to arrival. Patient was going out his door when he syncopized without warning. He states that he fell onto his right side and is currently complaining of right hip pain, bilateral knee pain, right posterior shoulder pain, and mid chest pain that is worse with palpation. No shortness of breath. Patient does not have any signs of head injury, headache, neck pain. No weakness, numbness, or tingling in arms or legs. No urinary symptoms or incontinence. No back pain. Patient transported to the emergency department by EMS. No other treatments prior to arrival. Patient has no prior history of coronary artery disease or lung disease. Denies hypertension, high cholesterol, smoking, family history. No previous episodes of passing out.  Patient is a 79 y.o. male presenting with fall and hip pain. The history is provided by the patient.  Fall Associated symptoms include arthralgias and chest pain. Pertinent negatives include no abdominal pain, coughing, diaphoresis, fatigue, fever, headaches, nausea, neck pain, numbness, rash, vomiting or weakness.  Hip Pain Associated symptoms include arthralgias and chest pain. Pertinent negatives include no abdominal pain, coughing, diaphoresis, fatigue, fever, headaches, nausea, neck pain, numbness, rash, vomiting or weakness.    Past Medical History  Diagnosis Date  . Diabetes mellitus without complication    History reviewed. No pertinent past surgical history. History reviewed. No pertinent family history. Social History  Substance Use Topics  .  Smoking status: Never Smoker   . Smokeless tobacco: None  . Alcohol Use: Yes     Comment: very little    Review of Systems  Constitutional: Negative for fever, diaphoresis and fatigue.  HENT: Negative for tinnitus.   Eyes: Negative for photophobia, pain, redness and visual disturbance.  Respiratory: Negative for cough and shortness of breath.   Cardiovascular: Positive for chest pain. Negative for palpitations and leg swelling.  Gastrointestinal: Negative for nausea, vomiting and abdominal pain.  Genitourinary: Negative for dysuria.  Musculoskeletal: Positive for arthralgias and gait problem. Negative for back pain and neck pain.  Skin: Negative for rash and wound.  Neurological: Positive for syncope. Negative for dizziness, weakness, light-headedness, numbness and headaches.  Psychiatric/Behavioral: Negative for confusion and decreased concentration. The patient is not nervous/anxious.     Allergies  Review of patient's allergies indicates no known allergies.  Home Medications   Prior to Admission medications   Medication Sig Start Date End Date Taking? Authorizing Provider  albuterol (PROVENTIL HFA;VENTOLIN HFA) 108 (90 BASE) MCG/ACT inhaler Inhale 2 puffs into the lungs every 6 (six) hours as needed for wheezing or shortness of breath. 01/21/13   Theodis Blaze, MD  albuterol (PROVENTIL) (5 MG/ML) 0.5% nebulizer solution Take 0.5 mLs (2.5 mg total) by nebulization every 4 (four) hours as needed for wheezing or shortness of breath. 01/21/13   Theodis Blaze, MD  azithromycin (ZITHROMAX) 250 MG tablet Take 2 tablets (500 mg total) by mouth daily. 01/21/13   Theodis Blaze, MD  Blood Glucose Monitoring Suppl (ONE TOUCH ULTRA SYSTEM KIT) W/DEVICE KIT 1 kit by Does not apply route once. 01/21/13   Theodis Blaze, MD  Chlorphen-Pseudoephed-APAP (CORICIDIN D PO)  Take 2 tablets by mouth daily as needed (cold).    Historical Provider, MD  metFORMIN (GLUCOPHAGE) 1000 MG tablet Take 1 tablet  (1,000 mg total) by mouth 2 (two) times daily with a meal. 01/21/13   Theodis Blaze, MD  predniSONE (DELTASONE) 50 MG tablet Take 1 tablet (50 mg total) by mouth daily with breakfast. Take 50 mg tablet today and taper down by 10 mg daily until completed 01/21/13   Theodis Blaze, MD   BP 114/84 mmHg  Pulse 94  Temp(Src) 98.3 F (36.8 C) (Oral)  Resp 19  Ht _0  (1.778 m)  Wt 200 lb (90.719 kg)  BMI 28.70 kg/m2  SpO2 98%   Physical Exam  Constitutional: He appears well-developed and well-nourished.  HENT:  Head: Normocephalic and atraumatic.  Mouth/Throat: Mucous membranes are normal. Mucous membranes are not dry.  Eyes: Conjunctivae are normal.  Neck: Trachea normal and normal range of motion. Neck supple. Normal carotid pulses and no JVD present. No muscular tenderness present. Carotid bruit is not present. No tracheal deviation present.  Cardiovascular: Normal rate, regular rhythm, S1 normal, S2 normal and intact distal pulses.  Exam reveals no distant heart sounds and no decreased pulses.   Murmur (II/VI LSB, systolic) heard. Pulmonary/Chest: Effort normal and breath sounds normal. No respiratory distress. He has no wheezes. He exhibits no tenderness.  Abdominal: Soft. Normal aorta and bowel sounds are normal. There is no tenderness. There is no rebound and no guarding.  Musculoskeletal: He exhibits no edema.       Right shoulder: He exhibits tenderness (Posterior). He exhibits normal range of motion and no bony tenderness.       Left shoulder: Normal.       Right elbow: Normal.      Left elbow: Normal.       Right wrist: Normal.       Left wrist: Normal.       Right hip: He exhibits decreased range of motion (inability of abduction/abduction, he is able to slightly flex at his hip.).       Left hip: Normal.       Right knee: He exhibits ecchymosis. Tenderness found.       Left knee: He exhibits ecchymosis. Tenderness found.       Right ankle: Normal.       Left ankle: Normal.        Cervical back: Normal.       Thoracic back: Normal.       Lumbar back: Normal.       Legs: Neurological: He is alert.  Skin: Skin is warm and dry. He is not diaphoretic. No cyanosis. No pallor.  Psychiatric: He has a normal mood and affect.  Nursing note and vitals reviewed.   ED Course  Procedures (including critical care time) Labs Review Labs Reviewed  CBC WITH DIFFERENTIAL/PLATELET  BASIC METABOLIC PANEL  I-STAT TROPOININ, ED    Imaging Review Dg Chest 2 View  09/17/2014   CLINICAL DATA:  Hip pain post fall, diabetes mellitus  EXAM: CHEST  2 VIEW  COMPARISON:  01/19/2013  FINDINGS: Upper normal heart size.  Mediastinal contours and pulmonary vascularity normal.  Lungs clear.  No pleural effusion or pneumothorax.  No acute osseous findings.  IMPRESSION: No acute abnormalities.   Electronically Signed   By: Lavonia Dana M.D.   On: 09/17/2014 14:56   Dg Hip Unilat With Pelvis 2-3 Views Right  09/17/2014   CLINICAL DATA:  Acute right  hip pain after fall.  Initial encounter.  EXAM: DG HIP (WITH OR WITHOUT PELVIS) 2-3V RIGHT  COMPARISON:  None.  FINDINGS: Mildly displaced proximal right femoral neck fracture is noted. This appears closed and posttraumatic. Femoral head remains position within the acetabulum.  IMPRESSION: Mildly displaced proximal right femoral neck fracture.   Electronically Signed   By: Marijo Conception, M.D.   On: 09/17/2014 14:55   I have personally reviewed and evaluated these images and lab results as part of my medical decision-making.  Patient seen and examined. Work-up initiated.    Vital signs reviewed and are as follows: BP 114/84 mmHg  Pulse 94  Temp(Src) 98.3 F (36.8 C) (Oral)  Resp 19  Ht _0  (1.778 m)  Wt 200 lb (90.719 kg)  BMI 28.70 kg/m2  SpO2 98%   2:28 PM Patient was discussed with Gareth Morgan, MD  ED ECG REPORT (13:25:59)   Date: 09/17/2014  Rate: 101  Rhythm: sinus tachycardia  QRS Axis: left  Intervals: normal  ST/T  Wave abnormalities: normal  Conduction Disutrbances:none  Narrative Interpretation:   Old EKG Reviewed: unchanged  I have personally reviewed the EKG tracing and agree with the computerized printout as noted.  3:06 PM Hip is fractured. Will talk with ortho. Will need admission for hip fracture and syncope.   3:13 PM Spoke with Dr. Percell Miller. He will see patient tonight or tomorrow morning. Plan for surgery tomorrow afternoon. Labs pending and will admit.   3:51 PM Labs being drawn now. Will need hospitalist admission when these return.   Handoff to Hedges PA-C at shift change.    MDM   Final diagnoses:  Femoral neck fracture, right, closed, initial encounter  Syncope, unspecified syncope type   Admit.     Carlisle Cater, PA-C 09/17/14 [place], MD 09/18/14 [phone]

## 2014-09-17 NOTE — Consult Note (Signed)
ORTHOPAEDIC CONSULTATION  REQUESTING PHYSICIAN: Lacretia Leigh, MD  Chief Complaint: right hip fracture  HPI: Isaac Haynes is a 79 y.o. male who complains of  a mechanical fall onto his right hip when he fell down a few stairs. He complains of pain at the right hip. No other complaints. He was immediately ambulator with no assistive devices.  Past Medical History  Diagnosis Date  . Diabetes mellitus without complication    History reviewed. No pertinent past surgical history. Social History   Social History  . Marital Status: Married    Spouse Name: N/A  . Number of Children: N/A  . Years of Education: N/A   Social History Main Topics  . Smoking status: Never Smoker   . Smokeless tobacco: None  . Alcohol Use: Yes     Comment: very little  . Drug Use: No  . Sexual Activity: Not Asked   Other Topics Concern  . None   Social History Narrative   History reviewed. No pertinent family history. No Known Allergies Prior to Admission medications   Medication Sig Start Date End Date Taking? Authorizing Provider  albuterol (PROVENTIL HFA;VENTOLIN HFA) 108 (90 BASE) MCG/ACT inhaler Inhale 2 puffs into the lungs every 6 (six) hours as needed for wheezing or shortness of breath. 01/21/13  Yes Theodis Blaze, MD  albuterol (PROVENTIL) (5 MG/ML) 0.5% nebulizer solution Take 0.5 mLs (2.5 mg total) by nebulization every 4 (four) hours as needed for wheezing or shortness of breath. 01/21/13  Yes Theodis Blaze, MD  aspirin 325 MG EC tablet Take 325 mg by mouth daily as needed for pain.   Yes Historical Provider, MD  Blood Glucose Monitoring Suppl (ONE TOUCH ULTRA SYSTEM KIT) W/DEVICE KIT 1 kit by Does not apply route once. 01/21/13  Yes Theodis Blaze, MD  Chlorphen-Pseudoephed-APAP (CORICIDIN D PO) Take 2 tablets by mouth daily as needed (cold).   Yes Historical Provider, MD  metFORMIN (GLUCOPHAGE) 1000 MG tablet Take 1 tablet (1,000 mg total) by mouth 2 (two) times daily with a meal.  01/21/13  Yes Theodis Blaze, MD   Dg Chest 2 View  09/17/2014   CLINICAL DATA:  Hip pain post fall, diabetes mellitus  EXAM: CHEST  2 VIEW  COMPARISON:  01/19/2013  FINDINGS: Upper normal heart size.  Mediastinal contours and pulmonary vascularity normal.  Lungs clear.  No pleural effusion or pneumothorax.  No acute osseous findings.  IMPRESSION: No acute abnormalities.   Electronically Signed   By: Lavonia Dana M.D.   On: 09/17/2014 14:56   Dg Hip Unilat With Pelvis 2-3 Views Right  09/17/2014   CLINICAL DATA:  Acute right hip pain after fall.  Initial encounter.  EXAM: DG HIP (WITH OR WITHOUT PELVIS) 2-3V RIGHT  COMPARISON:  None.  FINDINGS: Mildly displaced proximal right femoral neck fracture is noted. This appears closed and posttraumatic. Femoral head remains position within the acetabulum.  IMPRESSION: Mildly displaced proximal right femoral neck fracture.   Electronically Signed   By: Marijo Conception, M.D.   On: 09/17/2014 14:55    Positive ROS: All other systems have been reviewed and were otherwise negative with the exception of those mentioned in the HPI and as above.  Labs cbc  Recent Labs  09/17/14 1558  WBC 12.9*  HGB 14.0  HCT 39.7  PLT 182    Labs inflam No results for input(s): CRP in the last 72 hours.  Invalid input(s): ESR  Labs coag No  results for input(s): INR, PTT in the last 72 hours.  Invalid input(s): PT   Recent Labs  09/17/14 1558  NA 137  K 3.7  CL 104  CO2 21*  GLUCOSE 261*  BUN 16  CREATININE 0.91  CALCIUM 9.1    Physical Exam: Filed Vitals:   09/17/14 1630  BP: 146/129  Pulse: 104  Temp:   Resp: 25   General: Alert, no acute distress Cardiovascular: No pedal edema Respiratory: No cyanosis, no use of accessory musculature GI: No organomegaly, abdomen is soft and non-tender Skin: No lesions in the area of chief complaint other than those listed below in MSK exam.  Neurologic: Sensation intact distally Psychiatric: Patient is  competent for consent with normal mood and affect Lymphatic: No axillary or cervical lymphadenopathy  MUSCULOSKELETAL:  Right lower extremity compartments are soft used distally neurovascular intact skin is benign. Other extremities are atraumatic with painless ROM and NVI.  Assessment: Right displaced femoral neck fracture  Plan: OR today for hip hemi ASA post op WBAT post op   Renette Butters, MD Cell [phone]   09/17/2014 4:38 PM

## 2014-09-18 ENCOUNTER — Inpatient Hospital Stay (HOSPITAL_COMMUNITY): Payer: Medicare Other

## 2014-09-18 ENCOUNTER — Encounter (HOSPITAL_COMMUNITY): Payer: Self-pay | Admitting: Certified Registered Nurse Anesthetist

## 2014-09-18 ENCOUNTER — Inpatient Hospital Stay (HOSPITAL_COMMUNITY): Payer: Medicare Other | Admitting: Anesthesiology

## 2014-09-18 ENCOUNTER — Encounter (HOSPITAL_COMMUNITY)
Admission: EM | Disposition: A | Discharge status: Skilled Nursing Facility | Payer: Self-pay | Source: Home / Self Care | Attending: Internal Medicine

## 2014-09-18 DIAGNOSIS — S72001D Fracture of unspecified part of neck of right femur, subsequent encounter for closed fracture with routine healing: Secondary | ICD-10-CM

## 2014-09-18 HISTORY — PX: HIP ARTHROPLASTY: SHX981

## 2014-09-18 LAB — CBC
HCT: 35.9 % — ABNORMAL LOW (ref 39.0–52.0)
Hemoglobin: 12.7 g/dL — ABNORMAL LOW (ref 13.0–17.0)
MCH: 30.5 pg (ref 26.0–34.0)
MCHC: 35.4 g/dL (ref 30.0–36.0)
MCV: 86.1 fL (ref 78.0–100.0)
PLATELETS: 185 10*3/uL (ref 150–400)
RBC: 4.17 MIL/uL — ABNORMAL LOW (ref 4.22–5.81)
RDW: 12.9 % (ref 11.5–15.5)
WBC: 10.8 10*3/uL — ABNORMAL HIGH (ref 4.0–10.5)

## 2014-09-18 LAB — GLUCOSE, CAPILLARY
GLUCOSE-CAPILLARY: 300 mg/dL — AB (ref 65–99)
GLUCOSE-CAPILLARY: 248 mg/dL — AB (ref 65–99)
GLUCOSE-CAPILLARY: 183 mg/dL — AB (ref 65–99)
Glucose-Capillary: 229 mg/dL — ABNORMAL HIGH (ref 65–99)
Glucose-Capillary: 224 mg/dL — ABNORMAL HIGH (ref 65–99)
Glucose-Capillary: 221 mg/dL — ABNORMAL HIGH (ref 65–99)

## 2014-09-18 LAB — SURGICAL PCR SCREEN
MRSA, PCR: NEGATIVE
STAPHYLOCOCCUS AUREUS: NEGATIVE

## 2014-09-18 SURGERY — HEMIARTHROPLASTY, HIP, DIRECT ANTERIOR APPROACH, FOR FRACTURE
Anesthesia: Spinal | Site: Hip | Laterality: Right

## 2014-09-18 MED ORDER — PHENYLEPHRINE HCL 10 MG/ML IJ SOLN
INTRAMUSCULAR | Status: DC | PRN
  Administered 2014-09-18: 120 ug via INTRAVENOUS
  Administered 2014-09-18: 80 ug via INTRAVENOUS

## 2014-09-18 MED ORDER — OXYCODONE HCL 5 MG PO TABS: 5.0000 mg | ORAL_TABLET | Freq: Once | ORAL | Status: DC | PRN

## 2014-09-18 MED ORDER — FENTANYL CITRATE (PF) 250 MCG/5ML IJ SOLN
INTRAMUSCULAR | Status: AC
  Filled 2014-09-18: qty 5

## 2014-09-18 MED ORDER — ONDANSETRON HCL 4 MG PO TABS: 4.0000 mg | ORAL_TABLET | Freq: Four times a day (QID) | ORAL | Status: DC | PRN

## 2014-09-18 MED ORDER — ONDANSETRON HCL 4 MG/2ML IJ SOLN: 4.0000 mg | Freq: Four times a day (QID) | INTRAMUSCULAR | Status: DC | PRN

## 2014-09-18 MED ORDER — ASPIRIN EC 325 MG PO TBEC: 325.0000 mg | DELAYED_RELEASE_TABLET | Freq: Every day | ORAL | Status: DC

## 2014-09-18 MED ORDER — OXYCODONE HCL 5 MG/5ML PO SOLN: 5.0000 mg | Freq: Once | ORAL | Status: DC | PRN

## 2014-09-18 MED ORDER — MENTHOL 3 MG MT LOZG: 1.0000 | LOZENGE | OROMUCOSAL | Status: DC | PRN

## 2014-09-18 MED ORDER — EPHEDRINE SULFATE 50 MG/ML IJ SOLN
INTRAMUSCULAR | Status: DC | PRN
  Administered 2014-09-18: 10 mg via INTRAVENOUS
  Administered 2014-09-18: 5 mg via INTRAVENOUS

## 2014-09-18 MED ORDER — ASPIRIN EC 325 MG PO TBEC
325.0000 mg | DELAYED_RELEASE_TABLET | Freq: Every day | ORAL | Status: DC
  Administered 2014-09-19 – 2014-09-21 (×3): 325 mg via ORAL
  Filled 2014-09-18 (×3): qty 1

## 2014-09-18 MED ORDER — ACETAMINOPHEN 325 MG PO TABS
650.0000 mg | ORAL_TABLET | Freq: Four times a day (QID) | ORAL | Status: DC | PRN
  Administered 2014-09-19: 650 mg via ORAL
  Filled 2014-09-18: qty 2

## 2014-09-18 MED ORDER — PHENOL 1.4 % MT LIQD: 1.0000 | OROMUCOSAL | Status: DC | PRN

## 2014-09-18 MED ORDER — ARTIFICIAL TEARS OP OINT
TOPICAL_OINTMENT | OPHTHALMIC | Status: DC | PRN
  Administered 2014-09-18: 1 via OPHTHALMIC

## 2014-09-18 MED ORDER — PROPOFOL 10 MG/ML IV BOLUS
INTRAVENOUS | Status: DC | PRN
  Administered 2014-09-18: 100 mg via INTRAVENOUS

## 2014-09-18 MED ORDER — ROCURONIUM BROMIDE 100 MG/10ML IV SOLN
INTRAVENOUS | Status: DC | PRN
  Administered 2014-09-18: 40 mg via INTRAVENOUS
  Administered 2014-09-18: 10 mg via INTRAVENOUS

## 2014-09-18 MED ORDER — CEFAZOLIN SODIUM-DEXTROSE 2-3 GM-% IV SOLR
INTRAVENOUS | Status: AC
  Administered 2014-09-18: 2 g via INTRAVENOUS
  Filled 2014-09-18: qty 50

## 2014-09-18 MED ORDER — ONDANSETRON HCL 4 MG/2ML IJ SOLN
INTRAMUSCULAR | Status: DC | PRN
  Administered 2014-09-18: 4 mg via INTRAVENOUS

## 2014-09-18 MED ORDER — METOCLOPRAMIDE HCL 5 MG PO TABS: 5.0000 mg | ORAL_TABLET | Freq: Three times a day (TID) | ORAL | Status: DC | PRN

## 2014-09-18 MED ORDER — ACETAMINOPHEN 650 MG RE SUPP: 650.0000 mg | Freq: Four times a day (QID) | RECTAL | Status: DC | PRN

## 2014-09-18 MED ORDER — LACTATED RINGERS IV SOLN
INTRAVENOUS | Status: DC | PRN
  Administered 2014-09-18 (×2): via INTRAVENOUS

## 2014-09-18 MED ORDER — HYDROCODONE-ACETAMINOPHEN 5-325 MG PO TABS: 1.0000 | ORAL_TABLET | ORAL | Status: DC | PRN

## 2014-09-18 MED ORDER — CEFAZOLIN SODIUM-DEXTROSE 2-3 GM-% IV SOLR
2.0000 g | Freq: Four times a day (QID) | INTRAVENOUS | Status: AC
  Administered 2014-09-18 – 2014-09-19 (×2): 2 g via INTRAVENOUS
  Filled 2014-09-18 (×3): qty 50

## 2014-09-18 MED ORDER — PHENYLEPHRINE HCL 10 MG/ML IJ SOLN
10.0000 mg | INTRAVENOUS | Status: DC | PRN
  Administered 2014-09-18: 50 ug/min via INTRAVENOUS

## 2014-09-18 MED ORDER — LIDOCAINE HCL (CARDIAC) 20 MG/ML IV SOLN
INTRAVENOUS | Status: DC | PRN
  Administered 2014-09-18: 100 mg via INTRAVENOUS

## 2014-09-18 MED ORDER — HYDROMORPHONE HCL 1 MG/ML IJ SOLN
INTRAMUSCULAR | Status: AC
  Administered 2014-09-18: 0.5 mg via INTRAVENOUS
  Filled 2014-09-18: qty 1

## 2014-09-18 MED ORDER — SUCCINYLCHOLINE 20MG/ML (10ML) SYRINGE FOR MEDFUSION PUMP - OPTIME
INTRAMUSCULAR | Status: DC | PRN
  Administered 2014-09-18: 120 mg via INTRAVENOUS

## 2014-09-18 MED ORDER — TRANEXAMIC ACID 1000 MG/10ML IV SOLN
1000.0000 mg | INTRAVENOUS | Status: AC
Start: 2014-09-18 — End: 2014-09-18
  Administered 2014-09-18: 1000 mg via INTRAVENOUS
  Filled 2014-09-18: qty 10

## 2014-09-18 MED ORDER — CEFAZOLIN SODIUM-DEXTROSE 2-3 GM-% IV SOLR
INTRAVENOUS | Status: AC
  Filled 2014-09-18: qty 50

## 2014-09-18 MED ORDER — METOCLOPRAMIDE HCL 5 MG/ML IJ SOLN: 5.0000 mg | Freq: Three times a day (TID) | INTRAMUSCULAR | Status: DC | PRN

## 2014-09-18 MED ORDER — LACTATED RINGERS IV SOLN
INTRAVENOUS | Status: DC
  Administered 2014-09-18: 13:00:00 via INTRAVENOUS

## 2014-09-18 MED ORDER — GLYCOPYRROLATE 0.2 MG/ML IJ SOLN
INTRAMUSCULAR | Status: DC | PRN
  Administered 2014-09-18: 0.6 mg via INTRAVENOUS

## 2014-09-18 MED ORDER — NEOSTIGMINE METHYLSULFATE 10 MG/10ML IV SOLN
INTRAVENOUS | Status: DC | PRN
  Administered 2014-09-18: 4 mg via INTRAVENOUS

## 2014-09-18 MED ORDER — FENTANYL CITRATE (PF) 100 MCG/2ML IJ SOLN
INTRAMUSCULAR | Status: DC | PRN
  Administered 2014-09-18: 50 ug via INTRAVENOUS

## 2014-09-18 MED ORDER — 0.9 % SODIUM CHLORIDE (POUR BTL) OPTIME
TOPICAL | Status: DC | PRN
  Administered 2014-09-18: 1000 mL

## 2014-09-18 MED ORDER — PROMETHAZINE HCL 25 MG/ML IJ SOLN: 6.2500 mg | INTRAMUSCULAR | Status: DC | PRN

## 2014-09-18 MED ORDER — HYDROMORPHONE HCL 1 MG/ML IJ SOLN
0.2500 mg | INTRAMUSCULAR | Status: DC | PRN
  Administered 2014-09-18: 0.5 mg via INTRAVENOUS

## 2014-09-18 SURGICAL SUPPLY — 52 items
BIT DRILL 7/64X5 DISP (BIT) ×3 IMPLANT
BLADE SAGITTAL 25.0X1.27X90 (BLADE) ×2 IMPLANT
BLADE SAGITTAL 25.0X1.27X90MM (BLADE) ×1
CAPT HIP HEMI 2 ×3 IMPLANT
CLOSURE STERI-STRIP 1/2X4 (GAUZE/BANDAGES/DRESSINGS) ×1
CLSR STERI-STRIP ANTIMIC 1/2X4 (GAUZE/BANDAGES/DRESSINGS) ×3 IMPLANT
COVER SURGICAL LIGHT HANDLE (MISCELLANEOUS) ×3 IMPLANT
DRAPE IMP U-DRAPE 54X76 (DRAPES) ×5 IMPLANT
DRAPE ORTHO SPLIT 77X108 STRL (DRAPES) ×6
DRAPE SURG ORHT 6 SPLT 77X108 (DRAPES) ×2 IMPLANT
DRAPE U-SHAPE 47X51 STRL (DRAPES) ×3 IMPLANT
DRSG MEPILEX BORDER 4X12 (GAUZE/BANDAGES/DRESSINGS) ×2 IMPLANT
DRSG MEPILEX BORDER 4X8 (GAUZE/BANDAGES/DRESSINGS) ×3 IMPLANT
DURAPREP 26ML APPLICATOR (WOUND CARE) ×3 IMPLANT
ELECT CAUTERY BLADE 6.4 (BLADE) ×3 IMPLANT
ELECT REM PT RETURN 9FT ADLT (ELECTROSURGICAL) ×3
ELECTRODE REM PT RTRN 9FT ADLT (ELECTROSURGICAL) ×1 IMPLANT
FACESHIELD WRAPAROUND (MASK) ×6 IMPLANT
FACESHIELD WRAPAROUND OR TEAM (MASK) ×1 IMPLANT
GLOVE BIO SURGEON STRL SZ7 (GLOVE) ×3 IMPLANT
GLOVE BIO SURGEON STRL SZ7.5 (GLOVE) IMPLANT
GLOVE BIO SURGEON STRL SZ8 (GLOVE) ×4 IMPLANT
GLOVE BIOGEL M 6.5 STRL (GLOVE) ×2 IMPLANT
GLOVE BIOGEL PI IND STRL 7.0 (GLOVE) ×1 IMPLANT
GLOVE BIOGEL PI IND STRL 8 (GLOVE) ×1 IMPLANT
GLOVE BIOGEL PI INDICATOR 7.0 (GLOVE) ×4
GLOVE BIOGEL PI INDICATOR 8 (GLOVE) ×6
GOWN STRL REUS W/ TWL LRG LVL3 (GOWN DISPOSABLE) ×1 IMPLANT
GOWN STRL REUS W/ TWL XL LVL3 (GOWN DISPOSABLE) ×1 IMPLANT
GOWN STRL REUS W/TWL LRG LVL3 (GOWN DISPOSABLE) ×3
GOWN STRL REUS W/TWL XL LVL3 (GOWN DISPOSABLE) ×3
HIP CAPITATED HEMI 2 IMPLANT
KIT BASIN OR (CUSTOM PROCEDURE TRAY) ×3 IMPLANT
KIT ROOM TURNOVER OR (KITS) ×3 IMPLANT
MANIFOLD NEPTUNE II (INSTRUMENTS) ×3 IMPLANT
NS IRRIG 1000ML POUR BTL (IV SOLUTION) ×3 IMPLANT
PACK TOTAL JOINT (CUSTOM PROCEDURE TRAY) ×3 IMPLANT
PACK UNIVERSAL I (CUSTOM PROCEDURE TRAY) ×3 IMPLANT
PAD ARMBOARD 7.5X6 YLW CONV (MISCELLANEOUS) ×6 IMPLANT
PILLOW ABDUCTION HIP (SOFTGOODS) ×3 IMPLANT
RETRIEVER SUT HEWSON (MISCELLANEOUS) ×3 IMPLANT
SUT FIBERWIRE #2 38 REV NDL BL (SUTURE) ×6
SUT MNCRL AB 4-0 PS2 18 (SUTURE) ×3 IMPLANT
SUT MON AB 2-0 CT1 36 (SUTURE) ×3 IMPLANT
SUT VIC AB 0 CT1 27 (SUTURE) ×3
SUT VIC AB 0 CT1 27XBRD ANBCTR (SUTURE) IMPLANT
SUT VIC AB 1 CT1 27 (SUTURE) ×3
SUT VIC AB 1 CT1 27XBRD ANBCTR (SUTURE) ×1 IMPLANT
SUTURE FIBERWR#2 38 REV NDL BL (SUTURE) ×2 IMPLANT
TOWEL OR 17X24 6PK STRL BLUE (TOWEL DISPOSABLE) ×3 IMPLANT
TOWEL OR 17X26 10 PK STRL BLUE (TOWEL DISPOSABLE) ×3 IMPLANT
WATER STERILE IRR 1000ML POUR (IV SOLUTION) ×6 IMPLANT

## 2014-09-18 NOTE — Progress Notes (Signed)
[facility] PROGRESS NOTE  Isaac Haynes ZOX:[id number] DOB: 12/26/1935 DOA: 09/17/2014 PCP: Tomi Bamberger, NP     Assessment/Plan: Closed right hip fracture - secondary to mechanical fall. Doubt syncope.  -pain control with prn vicodin and morphine.  robaxin for muscle spasms.  bowel regimen. -seen by ortho ( Dr Eulah Pont). Rt hemiarthroplasty today. No further perioperative cardiac w/up necessary. Resume ASA. Does not need perioperative beta blockade.   Active Problems: Diabetes mellitus Hold metformin. monitor fsg. continue SSI   Right sided chest pain appears musculoskeletal from fall. No rib fractures seen on CXR. Improve with pain meds.  Diet:cardiac. NPO   DVT prophylaxis: sq lovenox  Code Status: full  Family Communication: none at bedside Disposition Plan: needs SNF upon d/c   Consultants:  Dr Eulah Pont  Procedures:  For rt hip hemiarthroplasty today  Antibiotics:  none  HPI/Subjective: C/o rt hip pain , tolerating pain meds.  Objective: Filed Vitals:   09/18/14 0608  BP: 125/61  Pulse: 86  Temp: 98.6 F (37 C)  Resp: 18    Intake/Output Summary (Last 24 hours) at 09/18/14 1053 Last data filed at 09/17/14 2200  Gross per 24 hour  Intake      0 ml  Output    200 ml  Net   -200 ml   Filed Weights   09/17/14 1323  Weight: 90.719 kg (200 lb)    Exam:  GEN: NAD HEENT:  moist oral mucosa, Cardiovascular: RRR, S1 normal, S2 normal, no MRG Chest: CTAB, no wheezes, rales, or rhonchi.  Abdominal: Soft. Non-tender, non-distended, bowel sounds are normal, Ext: warm, rt hip shortened and externally rotated.. Limited movement  Data Reviewed: Basic Metabolic Panel:  Recent Labs Lab 09/17/14 1558  NA 137  K 3.7  CL 104  CO2 21*  GLUCOSE 261*  BUN 16  CREATININE 0.91  CALCIUM 9.1   Liver Function Tests: No results for input(s): AST, ALT, ALKPHOS, BILITOT, PROT, ALBUMIN in the last 168 hours. No results for input(s): LIPASE,  AMYLASE in the last 168 hours. No results for input(s): AMMONIA in the last 168 hours. CBC:  Recent Labs Lab 09/17/14 1558 09/18/14 0417  WBC 12.9* 10.8*  NEUTROABS 10.1*  --   HGB 14.0 12.7*  HCT 39.7 35.9*  MCV 86.9 86.1  PLT 182 185   Cardiac Enzymes: No results for input(s): CKTOTAL, CKMB, CKMBINDEX, TROPONINI in the last 168 hours. BNP (last 3 results) No results for input(s): BNP in the last 8760 hours.  ProBNP (last 3 results) No results for input(s): PROBNP in the last 8760 hours.  CBG:  Recent Labs Lab 09/17/14 2211 09/18/14 0631  GLUCAP 296* 300*    Recent Results (from the past 240 hour(s))  Surgical pcr screen     Status: None   Collection Time: 09/18/14  5:04 AM  Result Value Ref Range Status   MRSA, PCR NEGATIVE NEGATIVE Final   Staphylococcus aureus NEGATIVE NEGATIVE Final    Comment:        The Xpert SA Assay (FDA approved for NASAL specimens in patients over 84 years of age), is one component of a comprehensive surveillance program.  Test performance has been validated by [facility] for patients greater than or equal to 74 year old. It is not intended to diagnose infection nor to guide or monitor treatment.      Studies: Dg Chest 2 View  09/17/2014   CLINICAL DATA:  Hip pain post fall, diabetes mellitus  EXAM: CHEST  2 VIEW  COMPARISON:  01/19/2013  FINDINGS: Upper normal heart size.  Mediastinal contours and pulmonary vascularity normal.  Lungs clear.  No pleural effusion or pneumothorax.  No acute osseous findings.  IMPRESSION: No acute abnormalities.   Electronically Signed   By: Ulyses Southward M.D.   On: 09/17/2014 14:56   Dg Hip Unilat With Pelvis 2-3 Views Right  09/17/2014   CLINICAL DATA:  Acute right hip pain after fall.  Initial encounter.  EXAM: DG HIP (WITH OR WITHOUT PELVIS) 2-3V RIGHT  COMPARISON:  None.  FINDINGS: Mildly displaced proximal right femoral neck fracture is noted. This appears closed and posttraumatic. Femoral head  remains position within the acetabulum.  IMPRESSION: Mildly displaced proximal right femoral neck fracture.   Electronically Signed   By: Lupita Raider, M.D.   On: 09/17/2014 14:55    Scheduled Meds: . enoxaparin (LOVENOX) injection  40 mg Subcutaneous Q24H  . insulin aspart  0-15 Units Subcutaneous TID WC  . insulin aspart  0-5 Units Subcutaneous QHS  . sodium chloride  3 mL Intravenous Q12H   Continuous Infusions: . sodium chloride 75 mL/hr at 09/17/14 2300      Time spent: 20 minutes    Canden Cieslinski  [facility] Pager [phone]. If 7PM-7AM, please contact night-coverage at [email or web address], password [facility] 09/18/2014, 10:53 AM  LOS: 1 day

## 2014-09-18 NOTE — Op Note (Signed)
09/17/2014 - 09/18/2014  3:10 PM  PATIENT:  Isaac Haynes   MRN: [id number]  PRE-OPERATIVE DIAGNOSIS:  RIGHT HIP FRACTURE  POST-OPERATIVE DIAGNOSIS:  RIGHT HIP FRACTURE  PROCEDURE:  Procedure(s): ARTHROPLASTY BIPOLAR HIP (HEMIARTHROPLASTY)  PREOPERATIVE INDICATIONS:  Isaac Haynes is an 79 y.o. male who was admitted 09/17/2014 with a diagnosis of Closed right hip fracture and elected for surgical management.  The risks benefits and alternatives were discussed with the patient including but not limited to the risks of nonoperative treatment, versus surgical intervention including infection, bleeding, nerve injury, periprosthetic fracture, the need for revision surgery, dislocation, leg length discrepancy, blood clots, cardiopulmonary complications, morbidity, mortality, among others, and they were willing to proceed.  Predicted outcome is good, although there will be at least a six to nine month expected recovery.   OPERATIVE REPORT     SURGEON:  Edmonia Lynch, MD    ASSISTANT:  none    ANESTHESIA:  General    COMPLICATIONS:  None.      COMPONENTS:  [employer] Acolade: Femoral stem: 4.5, Femoral Head:53, Neck:0   PROCEDURE IN DETAIL: The patient was met in the holding area and identified.  The appropriate hip  was marked at the operative site. The patient was then transported to the OR and  placed under general anesthesia.  At that point, the patient was  placed in the lateral decubitus position with the operative side up and  secured to the operating room table and all bony prominences padded.     The operative lower extremity was prepped from the iliac crest to the toes.  Sterile draping was performed.  Time out was performed prior to incision.      A routine posterolateral approach was utilized via sharp dissection  carried down to the subcutaneous tissue.  Gross bleeders were Bovie  coagulated.  The iliotibial band was identified and incised  along the length of the skin incision.   Self-retaining retractors were  inserted.  With the hip internally rotated, the short external rotators  were identified. The piriformis was tagged with FiberWire, and the hip capsule released in a T-type fashion.  The femoral neck was exposed, and I resected the femoral neck using the appropriate jig. This was performed at approximately a thumb's breadth above the lesser trochanter.    I then exposed the deep acetabulum, cleared out any tissue including the ligamentum teres, and included the hip capsule in the FiberWire used above and below the T.    I then prepared the proximal femur using the cookie-cutter, the lateralizing reamer, and then sequentially broached.  A trial utilized, and I reduced the hip and it was found to have excellent stability with functional range of motion. The trial components were then removed.   The canal and acetabulum were thoroughly irrigated  I inserted the pressfit stem and placed the head and neck collar. The hip was reduced with appropriate force and was stable through a range of motion.   I then used a 2 mm drill bits to pass the FiberWire suture from the capsule and puriform is through the greater trochanter, and secured this. Excellent posterior capsular repair was achieved. I also closed the T in the capsule.  I then irrigated the hip copiously again with pulse lavage, and repaired the fascia with Vicryl, followed by Vicryl for the subcutaneous tissue, Monocryl for the skin, Steri-Strips and sterile gauze. The wounds were injected. The patient was then awakened and returned to PACU in stable and satisfactory condition. There  were no complications.  POST-OP PLAN: Weight bearing as tolerated. DVT px will consist of SCD's and ASA 325  Edmonia Lynch, MD Orthopedic Surgeon [phone]   09/18/2014 3:10 PM   This note was generated using a template and dragon dictation system. In light of that, I have reviewed the note and all aspects of it are applicable  to this case. Any dictation errors are due to the computerized dictation system.

## 2014-09-18 NOTE — Progress Notes (Signed)
Called report to #[id number] - verbal update including recent CBG @ 1250 = 232. SSI administered @ 1219 of 5 Units.

## 2014-09-18 NOTE — Anesthesia Preprocedure Evaluation (Signed)
Anesthesia Evaluation  Patient identified by MRN, date of birth, ID band Patient awake    Reviewed: Allergy & Precautions, H&P , NPO status , Patient's Chart, lab work & pertinent test results  History of Anesthesia Complications Negative for: history of anesthetic complications  Airway Mallampati: II  TM Distance: >3 FB Neck ROM: full    Dental no notable dental hx.    Pulmonary neg pulmonary ROS,  breath sounds clear to auscultation  Pulmonary exam normal       Cardiovascular negative cardio ROS Normal cardiovascular examRhythm:regular Rate:Normal     Neuro/Psych negative neurological ROS     GI/Hepatic negative GI ROS, Neg liver ROS,   Endo/Other  diabetes, Well Controlled, Type 2  Renal/GU negative Renal ROS     Musculoskeletal   Abdominal   Peds  Hematology negative hematology ROS (+)   Anesthesia Other Findings   Reproductive/Obstetrics negative OB ROS                             Anesthesia Physical Anesthesia Plan  ASA: II  Anesthesia Plan: Spinal   Post-op Pain Management:    Induction:   Airway Management Planned: Simple Face Mask  Additional Equipment:   Intra-op Plan:   Post-operative Plan:   Informed Consent: I have reviewed the patients History and Physical, chart, labs and discussed the procedure including the risks, benefits and alternatives for the proposed anesthesia with the patient or authorized representative who has indicated his/her understanding and acceptance.     Plan Discussed with: Anesthesiologist, CRNA and Surgeon  Anesthesia Plan Comments: (Spinal if amenable, will place ETT if declines)        Anesthesia Quick Evaluation

## 2014-09-18 NOTE — Interval H&P Note (Signed)
History and Physical Interval Note:  09/18/2014 12:54 PM  Isaac Haynes  has presented today for surgery, with the diagnosis of RIGHT HIP FRACTURE  The various methods of treatment have been discussed with the patient and family. After consideration of risks, benefits and other options for treatment, the patient has consented to  Procedure(s): ARTHROPLASTY BIPOLAR HIP (HEMIARTHROPLASTY) (Right) as a surgical intervention .  The patient's history has been reviewed, patient examined, no change in status, stable for surgery.  I have reviewed the patient's chart and labs.  Questions were answered to the patient's satisfaction.     Riel Hirschman D

## 2014-09-18 NOTE — Transfer of Care (Signed)
Immediate Anesthesia Transfer of Care Note  Patient: Isaac Haynes  Procedure(s) Performed: Procedure(s): ARTHROPLASTY BIPOLAR HIP (HEMIARTHROPLASTY) (Right)  Patient Location: PACU  Anesthesia Type:General  Level of Consciousness: awake, patient cooperative and lethargic  Airway & Oxygen Therapy: Patient Spontanous Breathing and Patient connected to nasal cannula oxygen  Post-op Assessment: Report given to RN and Post -op Vital signs reviewed and stable  Post vital signs: Reviewed and stable  Last Vitals:  Filed Vitals:   09/18/14 1542  BP:   Pulse: 88  Temp: 36.6 C  Resp:     Complications: No apparent anesthesia complications

## 2014-09-18 NOTE — H&P (View-Only) (Signed)
I have reviewed his films and case Tentative plan is for Right hip hemiarthroplasty tomorrow (friday) at 1:30   Formal c/s to follow   Kennita Pavlovich D  

## 2014-09-18 NOTE — Anesthesia Procedure Notes (Signed)
Procedure Name: Intubation Date/Time: 09/18/2014 2:06 PM Performed by: Roney Mans P Pre-anesthesia Checklist: Patient identified, Emergency Drugs available, Suction available, Patient being monitored and Timeout performed Patient Re-evaluated:Patient Re-evaluated prior to inductionOxygen Delivery Method: Circle system utilized Preoxygenation: Pre-oxygenation with 100% oxygen Intubation Type: IV induction and Rapid sequence Laryngoscope Size: Mac and 4 Grade View: Grade I Tube type: Oral Tube size: 7.5 mm Number of attempts: 1 Airway Equipment and Method: Stylet Placement Confirmation: ETT inserted through vocal cords under direct vision,  positive ETCO2 and breath sounds checked- equal and bilateral Secured at: 21 cm Tube secured with: Tape Dental Injury: Teeth and Oropharynx as per pre-operative assessment

## 2014-09-19 ENCOUNTER — Inpatient Hospital Stay (HOSPITAL_COMMUNITY): Payer: Medicare Other

## 2014-09-19 DIAGNOSIS — G44019 Episodic cluster headache, not intractable: Secondary | ICD-10-CM

## 2014-09-19 LAB — GLUCOSE, CAPILLARY
GLUCOSE-CAPILLARY: 241 mg/dL — AB (ref 65–99)
Glucose-Capillary: 232 mg/dL — ABNORMAL HIGH (ref 65–99)
Glucose-Capillary: 289 mg/dL — ABNORMAL HIGH (ref 65–99)
Glucose-Capillary: 282 mg/dL — ABNORMAL HIGH (ref 65–99)
Glucose-Capillary: 316 mg/dL — ABNORMAL HIGH (ref 65–99)

## 2014-09-19 LAB — CBC
HEMATOCRIT: 33.7 % — AB (ref 39.0–52.0)
Hemoglobin: 11.5 g/dL — ABNORMAL LOW (ref 13.0–17.0)
MCH: 29.9 pg (ref 26.0–34.0)
MCHC: 34.1 g/dL (ref 30.0–36.0)
MCV: 87.8 fL (ref 78.0–100.0)
Platelets: 164 10*3/uL (ref 150–400)
RBC: 3.84 MIL/uL — ABNORMAL LOW (ref 4.22–5.81)
RDW: 13.3 % (ref 11.5–15.5)
WBC: 10.6 10*3/uL — ABNORMAL HIGH (ref 4.0–10.5)

## 2014-09-19 NOTE — Progress Notes (Signed)
SPORTS MEDICINE AND JOINT REPLACEMENT  Georgena Spurling, MD   Altamese Cabal, PA-C [address] [place], [place], [place]  [address]                             [phone]   PROGRESS NOTE  Subjective:  negative for Chest Pain  negative for Shortness of Breath  negative for Nausea/Vomiting   negative for Calf Pain  negative for Bowel Movement   Tolerating Diet: yes         Patient reports pain as 4 on 0-10 scale.    Objective: Vital signs in last 24 hours:   Patient Vitals for the past 24 hrs:  BP Temp Temp src Pulse Resp SpO2  09/19/14 0358 130/64 mmHg 98.8 F (37.1 C) Oral (!) 109 16 96 %  09/19/14 0105 134/63 mmHg 99.7 F (37.6 C) Oral (!) 113 16 97 %  09/18/14 1947 121/61 mmHg 98.1 F (36.7 C) Oral 97 16 97 %  09/18/14 1710 - - - - - 99 %  09/18/14 1655 (!) 109/48 mmHg 98.2 F (36.8 C) - 71 16 99 %  09/18/14 1630 (!) 104/45 mmHg 98.7 F (37.1 C) - 73 14 97 %  09/18/14 1615 (!) 106/43 mmHg - - 70 17 100 %  09/18/14 1600 (!) 121/51 mmHg - - 79 14 99 %  09/18/14 1545 (!) 130/56 mmHg - - 97 20 99 %  09/18/14 1542 - 97.9 F (36.6 C) - 88 20 90 %  09/18/14 1155 (!) 141/62 mmHg 98.6 F (37 C) Oral 79 18 100 %    @flow {1959:LAST@   Intake/Output from previous day:   08/26 0701 - 08/27 0700 In: 2185 [P.O.:360; I.V.:1825] Out: 1500 [Urine:1300]   Intake/Output this shift:       Intake/Output      08/26 0701 - 08/27 0700 08/27 0701 - 08/28 0700   P.O. 360    I.V. (mL/kg) 1825 (20.1)    Total Intake(mL/kg) 2185 (24.1)    Urine (mL/kg/hr) 1300 (0.6)    Stool     Blood 200 (0.1)    Total Output 1500     Net +685          Urine Occurrence 1 x       LABORATORY DATA:  Recent Labs  09/17/14 1558 09/18/14 0417 09/19/14 0344  WBC 12.9* 10.8* 10.6*  HGB 14.0 12.7* 11.5*  HCT 39.7 35.9* 33.7*  PLT 182 185 164    Recent Labs  09/17/14 1558  NA 137  K 3.7  CL 104  CO2 21*  BUN 16  CREATININE 0.91  GLUCOSE 261*  CALCIUM 9.1   No results found for:  INR, PROTIME  Examination:  General appearance: alert, cooperative and no distress Extremities: extremities normal, atraumatic, no cyanosis or edema and Homans sign is negative, no sign of DVT  Wound Exam: clean, dry, intact   Drainage:  None: wound tissue dry  Motor Exam: EHL and FHL Intact  Sensory Exam: Deep Peroneal normal   Assessment:    1 Day Post-Op  Procedure(s) (LRB): ARTHROPLASTY BIPOLAR HIP (HEMIARTHROPLASTY) (Right)  ADDITIONAL DIAGNOSIS:  Principal Problem:   Closed right hip fracture Active Problems:   DM2 (diabetes mellitus, type 2)  Acute Blood Loss Anemia   Plan: Physical Therapy as ordered Weight Bearing as Tolerated (WBAT)  DVT Prophylaxis:  Aspirin  DISCHARGE PLAN: Home vs SNF  Isaac Haynes 09/19/2014, 9:55 AM

## 2014-09-19 NOTE — Clinical Social Work Note (Signed)
Clinical Social Work Assessment  Patient Details  Name: Isaac Haynes MRN: [id number] Date of Birth: 1935-08-19  Date of referral:  09/19/14               Reason for consult:  Discharge Planning                Permission sought to share information with:  Case Manager, Facility Sport and exercise psychologist, Family Supports Permission granted to share information::  No   Housing/Transportation Living arrangements for the past 2 months:  [place] of Information:  Patient Patient Interpreter Needed:  None Criminal Activity/Legal Involvement Pertinent to Current Situation/Hospitalization:  No - Comment as needed Significant Relationships:  Spouse Lives with:  Self Do you feel safe going back to the place where you live?  Yes Need for family participation in patient care:  Yes (Comment)  Care giving concerns:  Patient does NOT report care giving concerns at this time.    Social Worker assessment / plan:  Holiday representative met with patient in reference to post-acute placement for SNF. CSW introduced CSW role and SNF process. CSW also reviewed and provided SNF list. Patient stated he is aware of PT's recommendations however would like to discuss with wife before making a decision. CSW asked permission to contact wife twice during assessment and patient denied indicating he wanted to speak to his wife before CSW made contact. Currently patient is NOT agreeable to SNF placement however will discuss with wife before making final decision. No further concerns reported by patient at this time. CSW will continue to follow pt and pt's family for continued support and to facilitate pt's discharge needs if SNF.  Employment status:  Retired Forensic scientist:  Medicare PT Recommendations:  [place] / Referral to community resources:  [place]  Patient/Family's Response to care:  Patient siting up in bed alert and oriented x4. Pt currently  is NOT agreeable to SNF placement and wishes to discuss discharge planning with family first. Pt pleasant and appreciated social work intervention.  Patient/Family's Understanding of and Emotional Response to Diagnosis, Current Treatment, and Prognosis: Pt understands PT's recommendations and that he has declined SNF search at this time. Patient only has Medicare Part A. CSW remains available as needed.  Emotional Assessment Appearance:  Appears stated age Attitude/Demeanor/Rapport:   (Pleasant ) Affect (typically observed):  Calm, Pleasant Orientation:  Oriented to Self, Oriented to Place, Oriented to  Time, Oriented to Situation Alcohol / Substance use:  Not Applicable Psych involvement (Current and /or in the community):  No (Comment)  Discharge Needs  Concerns to be addressed:  Denies Needs/Concerns at this time Readmission within the last 30 days:  No Current discharge risk:  None Barriers to Discharge:  Continued Medical Work up   [employer], MSW, LCSWA [phone] 09/19/2014 4:12 PM

## 2014-09-19 NOTE — Anesthesia Postprocedure Evaluation (Signed)
  Anesthesia Post-op Note  Patient: Isaac Haynes  Procedure(s) Performed: Procedure(s) (LRB): ARTHROPLASTY BIPOLAR HIP (HEMIARTHROPLASTY) (Right)  Patient Location: PACU  Anesthesia Type: General  Level of Consciousness: awake and alert   Airway and Oxygen Therapy: Patient Spontanous Breathing  Post-op Pain: mild  Post-op Assessment: Post-op Vital signs reviewed, Patient's Cardiovascular Status Stable, Respiratory Function Stable, Patent Airway and No signs of Nausea or vomiting  Last Vitals:  Filed Vitals:   09/19/14 0358  BP: 130/64  Pulse: 109  Temp: 37.1 C  Resp: 16    Post-op Vital Signs: stable   Complications: No apparent anesthesia complications

## 2014-09-19 NOTE — Progress Notes (Signed)
Patient wanted abduction pillow off this am and he wants limited interruptions.

## 2014-09-19 NOTE — Progress Notes (Signed)
OT Cancellation Note  Patient Details Name: Basir Niven MRN: [id number] DOB: 1935-03-01   Cancelled Treatment:    Reason Eval/Treat Not Completed: OT screened. Pt is Medicare and current D/C plan is SNF. No apparent immediate acute care OT needs, therefore will defer OT to SNF. If OT eval is needed please call Acute Rehab Dept. at [phone] or text page OT at [phone].    Earlie Raveling OTR/L [phone] 09/19/2014, 12:44 PM

## 2014-09-19 NOTE — Evaluation (Signed)
Physical Therapy Evaluation Patient Details Name: Isaac Haynes MRN: [id number] DOB: 1935-04-01 Today's Date: 09/19/2014   History of Present Illness  Pt is a 79 y/o M who sustained a mechanical fall w/ Rt hip fx, now s/p Rt hemiarthroplasty done on 09/18/14.  Pt's PMH on file includes DM.  Clinical Impression  Patient is s/p above surgery resulting in functional limitations due to the deficits listed below (see PT Problem List). Isaac Haynes was unable to achieve sit>stand this session and will require +2 assist to do so.  He lives alone and will not have any assist upon d/c, therefore recommending SNF placement. Patient will benefit from skilled PT to increase their independence and safety with mobility to allow discharge to the venue listed below.      Follow Up Recommendations SNF;Supervision/Assistance - 24 hour    Equipment Recommendations  Other (comment) (TBD by next venue of care)    Recommendations for Other Services       Precautions / Restrictions Precautions Precautions: Fall Precaution Comments: Pt reports he fell one time last year when he tripped on a concrete slab.  Restrictions Weight Bearing Restrictions: Yes RLE Weight Bearing: Weight bearing as tolerated      Mobility  Bed Mobility Overal bed mobility: Needs Assistance Bed Mobility: Supine to Sit     Supine to sit: Mod assist;HOB elevated     General bed mobility comments: Max verbal cues for technique and encouragement.  Pt anxious about fracturing his Rt hip again w/ any movement.  Educated pt on reason for mobility.  Incraseed time and heavy use of bed rails to achieve sitting.  Mod assist managing Rt LE.  O2 administered via [place] at 1LPM as his SpO2 was 88% on RA upon PT arrival.  Transfers                 General transfer comment: Attmepted sit>stand this session; however pt unable to achieve sit>stand despite verbal cues for hand and Bil LE placement.  Pt continues to attempt to pull on RW to stand  despite cues.  Pt very anxious about standing.   Ambulation/Gait                Stairs            Wheelchair Mobility    Modified Rankin (Stroke Patients Only)       Balance Overall balance assessment: Needs assistance;History of Falls Sitting-balance support: Bilateral upper extremity supported;Feet supported Sitting balance-Leahy Scale: Poor Sitting balance - Comments: Requires Bil UE support on bed or bed rail to maintain upright posture Postural control: Posterior lean                                   Pertinent Vitals/Pain Pain Assessment: 0-10 Pain Score: 3  Pain Location: Rt hip Pain Descriptors / Indicators: Grimacing;Guarding;Moaning;Sharp Pain Intervention(s): Limited activity within patient's tolerance;Monitored during session;Repositioned;Utilized relaxation techniques    Home Living Family/patient expects to be discharged to:: Skilled nursing facility                 Additional Comments: Pt lives alone    Prior Function Level of Independence: Independent with assistive device(s)         Comments: PTA pt would ambulate w/ cane most of the time     Hand Dominance        Extremity/Trunk Assessment   Upper Extremity Assessment: Generalized weakness  Lower Extremity Assessment: RLE deficits/detail RLE Deficits / Details: s/p fall and Rt hip hemiarthroplasty w/ associated weakness and limited ROM       Communication   Communication: No difficulties  Cognition Arousal/Alertness: Awake/alert Behavior During Therapy: Anxious Overall Cognitive Status: Within Functional Limits for tasks assessed                      General Comments      Exercises Total Joint Exercises Ankle Circles/Pumps: AROM;Both;15 reps;Supine Quad Sets: Strengthening;Both;10 reps;Supine Gluteal Sets: Strengthening;Both;10 reps;Supine      Assessment/Plan    PT Assessment Patient needs continued PT services  PT  Diagnosis Difficulty walking;Abnormality of gait;Generalized weakness;Acute pain   PT Problem List Decreased strength;Decreased range of motion;Decreased activity tolerance;Decreased balance;Decreased mobility;Decreased coordination;Decreased knowledge of use of DME;Decreased safety awareness;Decreased knowledge of precautions;Cardiopulmonary status limiting activity;Obesity;Decreased skin integrity;Pain  PT Treatment Interventions DME instruction;Gait training;Functional mobility training;Therapeutic activities;Therapeutic exercise;Balance training;Neuromuscular re-education;Patient/family education;Modalities   PT Goals (Current goals can be found in the Care Plan section) Acute Rehab PT Goals Patient Stated Goal: to have his pain go away PT Goal Formulation: With patient Time For Goal Achievement: 09/26/14 Potential to Achieve Goals: Good    Frequency Min 3X/week   Barriers to discharge Decreased caregiver support pt lives alone    Co-evaluation               End of Session Equipment Utilized During Treatment: Oxygen Activity Tolerance: Patient limited by pain;Patient limited by fatigue Patient left: in bed;with call bell/phone within reach;with nursing/sitter in room (nurse tech in room) Nurse Communication: Mobility status;Precautions;Weight bearing status;Other (comment) (pt needs to have bed pad and sheets changed)         Time: 4098-1191 PT Time Calculation (min) (ACUTE ONLY): 30 min   Charges:   PT Evaluation $Initial PT Evaluation Tier I: 1 Procedure PT Treatments $Therapeutic Exercise: 8-22 mins   PT G Codes:       Isaac Haynes PT, DPT [phone] Pager: [phone] 09/19/2014, 12:32 PM

## 2014-09-19 NOTE — Progress Notes (Signed)
Utilization review completed.  

## 2014-09-19 NOTE — Progress Notes (Signed)
[facility] PROGRESS NOTE  Padraic Marinos ZOX:[id number] DOB: 1935-10-18 DOA: 09/17/2014 PCP: Tomi Bamberger, NP     Assessment/Plan: Closed right hip fracture - secondary to mechanical fall. Doubt syncope.  -pain control with prn vicodin and morphine.  robaxin for muscle spasms.  bowel regimen. -Right hemiarthroplasty done on 8/26. Tolerated well. Aspirin for DVT prophylaxis. Up with therapy today.  discontinue Foley. -Pending PT evaluation. Will benefits from skilled nursing facility as patient lives alone.  Active Problems:  Frontal headache and blurred vision Patient notified the orthopedic surgeon yesterday prior to OR. On questioning this morning patient reports off and on headache and some blurred vision since his fall. Will obtain a head CT to rule out any intracranial injury or bleed.  Diabetes mellitus Hold metformin. . continue SSI   Right sided chest pain appears musculoskeletal from fall. No rib fractures seen on CXR. Resolved  with pain meds.  Diet:cardiac. NPO   DVT prophylaxis: sq lovenox  Code Status: full  Family Communication: none at bedside Disposition Plan: needs SNF upon d/c   Consultants:  Dr Eulah Pont  Procedures:   rt hip hemiarthroplasty on 8/26  Antibiotics:  none  HPI/Subjective: C/o rt hip pain , tolerating pain meds.  headache and blurred vision off and on for past 2 days  Objective: Filed Vitals:   09/19/14 0358  BP: 130/64  Pulse: 109  Temp: 98.8 F (37.1 C)  Resp: 16    Intake/Output Summary (Last 24 hours) at 09/19/14 1151 Last data filed at 09/19/14 0700  Gross per 24 hour  Intake   2185 ml  Output   1200 ml  Net    985 ml   Filed Weights   09/17/14 1323  Weight: 90.719 kg (200 lb)    Exam:  GEN: NAD HEENT:  moist oral mucosa, Cardiovascular: RRR, S1 normal, S2 normal, no murmurs Chest: CTAB,  Abdominal: Soft. Non-tender, non-distended, bowel sounds are normal, Ext: warm, clean dressing over right  hip   Data Reviewed: Basic Metabolic Panel:  Recent Labs Lab 09/17/14 1558  NA 137  K 3.7  CL 104  CO2 21*  GLUCOSE 261*  BUN 16  CREATININE 0.91  CALCIUM 9.1   Liver Function Tests: No results for input(s): AST, ALT, ALKPHOS, BILITOT, PROT, ALBUMIN in the last 168 hours. No results for input(s): LIPASE, AMYLASE in the last 168 hours. No results for input(s): AMMONIA in the last 168 hours. CBC:  Recent Labs Lab 09/17/14 1558 09/18/14 0417 09/19/14 0344  WBC 12.9* 10.8* 10.6*  NEUTROABS 10.1*  --   --   HGB 14.0 12.7* 11.5*  HCT 39.7 35.9* 33.7*  MCV 86.9 86.1 87.8  PLT 182 185 164   Cardiac Enzymes: No results for input(s): CKTOTAL, CKMB, CKMBINDEX, TROPONINI in the last 168 hours. BNP (last 3 results) No results for input(s): BNP in the last 8760 hours.  ProBNP (last 3 results) No results for input(s): PROBNP in the last 8760 hours.  CBG:  Recent Labs Lab 09/18/14 1335 09/18/14 1550 09/18/14 1800 09/18/14 2145 09/19/14 0630  GLUCAP 183* 229* 224* 221* 289*    Recent Results (from the past 240 hour(s))  Surgical pcr screen     Status: None   Collection Time: 09/18/14  5:04 AM  Result Value Ref Range Status   MRSA, PCR NEGATIVE NEGATIVE Final   Staphylococcus aureus NEGATIVE NEGATIVE Final    Comment:        The Xpert SA Assay (FDA approved for NASAL specimens in patients  over 5 years of age), is one component of a comprehensive surveillance program.  Test performance has been validated by [facility] for patients greater than or equal to 20 year old. It is not intended to diagnose infection nor to guide or monitor treatment.      Studies: Dg Chest 2 View  09/17/2014   CLINICAL DATA:  Hip pain post fall, diabetes mellitus  EXAM: CHEST  2 VIEW  COMPARISON:  01/19/2013  FINDINGS: Upper normal heart size.  Mediastinal contours and pulmonary vascularity normal.  Lungs clear.  No pleural effusion or pneumothorax.  No acute osseous findings.   IMPRESSION: No acute abnormalities.   Electronically Signed   By: Ulyses Southward M.D.   On: 09/17/2014 14:56   Pelvis Portable  09/18/2014   CLINICAL DATA:  Post RIGHT hip arthroplasty  EXAM: PORTABLE PELVIS 1-2 VIEWS  COMPARISON:  Portable exam 1636 hours compared to abdominal radiograph 08/06/2005  FINDINGS: RIGHT hip prosthesis in expected position on single AP view.  No fracture dislocation.  Visualized portions of pelvis appear intact, superior aspects excluded.  IMPRESSION: RIGHT hip prosthesis without acute complication.   Electronically Signed   By: Ulyses Southward M.D.   On: 09/18/2014 16:38   Dg Hip Unilat With Pelvis 2-3 Views Right  09/17/2014   CLINICAL DATA:  Acute right hip pain after fall.  Initial encounter.  EXAM: DG HIP (WITH OR WITHOUT PELVIS) 2-3V RIGHT  COMPARISON:  None.  FINDINGS: Mildly displaced proximal right femoral neck fracture is noted. This appears closed and posttraumatic. Femoral head remains position within the acetabulum.  IMPRESSION: Mildly displaced proximal right femoral neck fracture.   Electronically Signed   By: Lupita Raider, M.D.   On: 09/17/2014 14:55    Scheduled Meds: . aspirin EC  325 mg Oral Q breakfast  . enoxaparin (LOVENOX) injection  40 mg Subcutaneous Q24H  . insulin aspart  0-15 Units Subcutaneous TID WC  . insulin aspart  0-5 Units Subcutaneous QHS  . sodium chloride  3 mL Intravenous Q12H   Continuous Infusions: . sodium chloride 75 mL/hr at 09/19/14 0546  . lactated ringers 50 mL/hr at 09/18/14 1329      Time spent: 20 minutes    Eddie North  [facility] Pager [phone]. If 7PM-7AM, please contact night-coverage at [email or web address], password [facility] 09/19/2014, 11:51 AM  LOS: 2 days

## 2014-09-20 LAB — GLUCOSE, CAPILLARY
GLUCOSE-CAPILLARY: 235 mg/dL — AB (ref 65–99)
Glucose-Capillary: 247 mg/dL — ABNORMAL HIGH (ref 65–99)
Glucose-Capillary: 293 mg/dL — ABNORMAL HIGH (ref 65–99)
Glucose-Capillary: 251 mg/dL — ABNORMAL HIGH (ref 65–99)

## 2014-09-20 MED ORDER — METHOCARBAMOL 500 MG PO TABS
500.0000 mg | ORAL_TABLET | Freq: Four times a day (QID) | ORAL | Status: DC | PRN
  Administered 2014-09-20: 500 mg via ORAL
  Filled 2014-09-20: qty 1

## 2014-09-20 NOTE — Clinical Social Work Placement (Signed)
   CLINICAL SOCIAL WORK PLACEMENT  NOTE  Date:  09/20/2014  Patient Details  Name: Isaac Haynes MRN: [id number] Date of Birth: 1935-06-14  Clinical Social Work is seeking post-discharge placement for this patient at the Skilled  Nursing Facility level of care (*CSW will initial, date and re-position this form in  chart as items are completed):  Yes   Patient/family provided with [place] Clinical Social Work Department's list of facilities offering this level of care within the geographic area requested by the patient (or if unable, by the patient's family).  Yes   Patient/family informed of their freedom to choose among providers that offer the needed level of care, that participate in Medicare, Medicaid or managed care program needed by the patient, have an available bed and are willing to accept the patient.  Yes   Patient/family informed of [facility]'s ownership interest in [facility] and [facility], as well as of the fact that they are under no obligation to receive care at these facilities.  PASRR submitted to EDS on 09/20/14     PASRR number received on 09/20/14     Existing PASRR number confirmed on       FL2 transmitted to all facilities in geographic area requested by pt/family on 09/20/14     FL2 transmitted to all facilities within larger geographic area on       Patient informed that his/her managed care company has contracts with or will negotiate with certain facilities, including the following:            Patient/family informed of bed offers received.  Patient chooses bed at       Physician recommends and patient chooses bed at      Patient to be transferred to   on  .  Patient to be transferred to facility by       Patient family notified on   of transfer.  Name of family member notified:        PHYSICIAN Please sign FL2     Additional Comment:    _______________________________________________ Derenda Fennel, MSW, LCSWA [phone] 09/20/2014 4:21 PM

## 2014-09-20 NOTE — Progress Notes (Signed)
Physical Therapy Treatment Patient Details Name: Isaac Haynes MRN: [id number] DOB: May 18, 1935 Today's Date: 09/20/2014    History of Present Illness Pt is a 79 y/o M who sustained a mechanical fall w/ Rt hip fx, now s/p Rt hemiarthroplasty done on 09/18/14.  Pt's PMH on file includes DM.    PT Comments    Pt's wife and daughter in law would like to talk w/ SW about SNF placement.  Their phone number is [phone].  Pt w/ modest progress w/ PT, requiring max +2 assist for sit<>stand and stand pivot.  Pt will benefit from continued skilled PT services to increase functional independence and safety.   Follow Up Recommendations  SNF;Supervision/Assistance - 24 hour     Equipment Recommendations  Other (comment) (TBD by next venue of care)    Recommendations for Other Services       Precautions / Restrictions Precautions Precautions: Fall Precaution Comments: Pt reports he fell one time last year when he tripped on a concrete slab.  Restrictions Weight Bearing Restrictions: Yes RLE Weight Bearing: Weight bearing as tolerated    Mobility  Bed Mobility Overal bed mobility: Needs Assistance;+2 for physical assistance Bed Mobility: Supine to Sit     Supine to sit: Mod assist;HOB elevated     General bed mobility comments: Max verbal cues for technique and encouragement.  Incraseed time and heavy use of bed rails to achieve sitting.  Mod assist managing Rt LE and supporting trunk.    Transfers Overall transfer level: Needs assistance Equipment used: Rolling walker (2 wheeled) Transfers: Sit to/from [employer] Sit to Stand: Max assist;+2 physical assistance;From elevated surface Stand pivot transfers: Max assist;+2 physical assistance       General transfer comment: Max +2 assist w/ max encouragement and max verbal cues for technique as pt continues to attempt to pull on RW rather than push from bed.  Upon standing pt trunk flexed and cues provided to stand  upright.  Max +2 assist w/ stand pivot as pt shuffles his feet rather than picking them up and after taking only a few steps he says, "I'm done" and begins to sit, requiring re-direction to ensure pt sits in chair upon descent.  Ambulation/Gait                 Stairs            Wheelchair Mobility    Modified Rankin (Stroke Patients Only)       Balance Overall balance assessment: Needs assistance;History of Falls Sitting-balance support: Bilateral upper extremity supported;Feet supported Sitting balance-Leahy Scale: Poor Sitting balance - Comments: Requires Bil UE support on bed or bed rail to maintain upright posture Postural control: Posterior lean Standing balance support: Bilateral upper extremity supported;During functional activity Standing balance-Leahy Scale: Zero                      Cognition Arousal/Alertness: Awake/alert Behavior During Therapy: Anxious Overall Cognitive Status: Within Functional Limits for tasks assessed                      Exercises Total Joint Exercises Ankle Circles/Pumps: AROM;Both;15 reps;Seated Gluteal Sets: Strengthening;Both;10 reps;Seated    General Comments General comments (skin integrity, edema, etc.): Spoke to pt and pt's family about going to SNF prior to returning home for pt's safety, all verbalize understanding.  Pt's wife and daughter in law would like to talk w/ SW about SNF placement.  Their phone number is [phone].      Pertinent Vitals/Pain Pain Assessment: 0-10 Pain Score: 6  Pain Location: Rt hip Pain Descriptors / Indicators: Aching Pain Intervention(s): Limited activity within patient's tolerance;Monitored during session;Repositioned    Home Living                      Prior Function            PT Goals (current goals can now be found in the care plan section) Acute Rehab PT Goals Patient Stated Goal: to have his pain go away PT Goal Formulation: With  patient Time For Goal Achievement: 09/26/14 Potential to Achieve Goals: Good Progress towards PT goals: Progressing toward goals (very modestly)    Frequency  Min 3X/week    PT Plan Current plan remains appropriate    Co-evaluation             End of Session Equipment Utilized During Treatment: Oxygen;Gait belt Activity Tolerance: Patient limited by pain;Patient limited by fatigue Patient left: with call bell/phone within reach;with family/visitor present;in chair (nurse tech in room)     Time: 1191-4782 PT Time Calculation (min) (ACUTE ONLY): 30 min  Charges:  $Therapeutic Activity: 23-37 mins                    G Codes:      Michail Jewels PT, DPT [phone] Pager: [phone] 09/20/2014, 4:30 PM

## 2014-09-20 NOTE — Progress Notes (Signed)
SPORTS MEDICINE AND JOINT REPLACEMENT  Isaac Spurling, MD   Altamese Cabal, PA-C [address] [place], [place], [place]  [address]                             [phone]   PROGRESS NOTE  Subjective:  negative for Chest Pain  negative for Shortness of Breath  negative for Nausea/Vomiting   negative for Calf Pain  negative for Bowel Movement   Tolerating Diet: yes         Patient reports pain as 4 on 0-10 scale.    Objective: Vital signs in last 24 hours:   Patient Vitals for the past 24 hrs:  BP Temp Temp src Pulse Resp SpO2  09/20/14 0537 122/61 mmHg 98.1 F (36.7 C) Oral 93 18 100 %  09/19/14 1937 (!) 151/63 mmHg 98.6 F (37 C) Oral (!) 123 18 99 %  09/19/14 1444 128/65 mmHg 98.6 F (37 C) Oral 96 17 97 %    {1959:LAST@   Intake/Output from previous day:   08/27 0701 - 08/28 0700 In: 480 [P.O.:480] Out: 800 [Urine:800]   Intake/Output this shift:   08/28 0701 - 08/28 1900 In: 240 [P.O.:240] Out: -    Intake/Output      08/27 0701 - 08/28 0700 08/28 0701 - 08/29 0700   P.O. 480 240   I.V. (mL/kg)     Total Intake(mL/kg) 480 (5.3) 240 (2.6)   Urine (mL/kg/hr) 800 (0.4)    Blood     Total Output 800     Net -320 +240        Urine Occurrence 2 x       LABORATORY DATA:  Recent Labs  09/17/14 1558 09/18/14 0417 09/19/14 0344  WBC 12.9* 10.8* 10.6*  HGB 14.0 12.7* 11.5*  HCT 39.7 35.9* 33.7*  PLT 182 185 164    Recent Labs  09/17/14 1558  NA 137  K 3.7  CL 104  CO2 21*  BUN 16  CREATININE 0.91  GLUCOSE 261*  CALCIUM 9.1   No results found for: INR, PROTIME  Examination:  General appearance: alert, cooperative and no distress Extremities: Homans sign is negative, no sign of DVT  Wound Exam: clean, dry, intact   Drainage:  None: wound tissue dry  Motor Exam: EHL and FHL Intact  Sensory Exam: Deep Peroneal normal   Assessment:    2 Days Post-Op  Procedure(s) (LRB): ARTHROPLASTY BIPOLAR HIP (HEMIARTHROPLASTY)  (Right)  ADDITIONAL DIAGNOSIS:  Principal Problem:   Closed right hip fracture Active Problems:   DM2 (diabetes mellitus, type 2)  Acute Blood Loss Anemia   Plan: Physical Therapy as ordered Weight Bearing as Tolerated (WBAT)  DVT Prophylaxis:  Aspirin  DISCHARGE PLAN: Skilled Nursing Facility/Rehab          Isaac Haynes 09/20/2014, 9:17 AM

## 2014-09-20 NOTE — Clinical Social Work Note (Signed)
Clinical Social Worker met with patient again this morning in regards to SNF placement. Currently patient is declining SNF placement. Patient reported he plans to discuss discharge planning with his wife and family first before making decision. Patient is NOT agreeable to SNF search at this time. Patient did NOT grant permission for CSW to contact family. CSW confirmed that patient is from home alone.   CSW remains available as needed.   Glendon Axe, MSW, LCSWA [phone] 09/20/2014 10:24 AM

## 2014-09-20 NOTE — Discharge Instructions (Signed)
Follow posterior hip precautions  INSTRUCTIONS  o Remove items at home which could result in a fall. This includes throw rugs or furniture in walking pathways o ICE to the affected joint every three hours while awake for 30 minutes at a time, for at least the first 3-5 days, and then as needed for pain and swelling.  Continue to use ice for pain and swelling. You may notice swelling that will progress down to the foot and ankle.  This is normal after surgery.  Elevate your leg when you are not up walking on it.   o Continue to use the breathing machine you got in the hospital (incentive spirometer) which will help keep your temperature down.  It is common for your temperature to cycle up and down following surgery, especially at night when you are not up moving around and exerting yourself.  The breathing machine keeps your lungs expanded and your temperature down.   DIET:  As you were doing prior to hospitalization, we recommend a well-balanced diet.  DRESSING / WOUND CARE / SHOWERING  Keep the surgical dressing until follow up. IF THE DRESSING FALLS OFF or the wound gets wet inside, change the dressing with sterile gauze.  Please use good hand washing techniques before changing the dressing.  Do not use any lotions or creams on the incision until instructed by your surgeon.    ACTIVITY  o Increase activity slowly as tolerated, but follow the weight bearing instructions below.   o No driving for 6 weeks or until further direction given by your physician.  You cannot drive while taking narcotics.  o No lifting or carrying greater than 10 lbs. until further directed by your surgeon. o Avoid periods of inactivity such as sitting longer than an hour when not asleep. This helps prevent blood clots.  o You may return to work once you are authorized by your doctor.     WEIGHT BEARING   Weight bearing as tolerated with assist device (walker, cane, etc) as directed, use it as long as suggested by  your surgeon or therapist, typically at least 4-6 weeks.   POSTERIOR HIP PRECAUTIONS  CONSTIPATION  Constipation is defined medically as fewer than three stools per week and severe constipation as less than one stool per week.  Even if you have a regular bowel pattern at home, your normal regimen is likely to be disrupted due to multiple reasons following surgery.  Combination of anesthesia, postoperative narcotics, change in appetite and fluid intake all can affect your bowels.   YOU MUST use at least one of the following options; they are listed in order of increasing strength to get the job done.  They are all available over the counter, and you may need to use some, POSSIBLY even all of these options:    Drink plenty of fluids (prune juice may be helpful) and high fiber foods Colace 100 mg by mouth twice a day  Senokot for constipation as directed and as needed Dulcolax (bisacodyl), take with full glass of water  Miralax (polyethylene glycol) once or twice a day as needed.  If you have tried all these things and are unable to have a bowel movement in the first 3-4 days after surgery call either your surgeon or your primary doctor.    If you experience loose stools or diarrhea, hold the medications until you stool forms back up.  If your symptoms do not get better within 1 week or if they get worse, check  with your doctor.  If you experience "the worst abdominal pain ever" or develop nausea or vomiting, please contact the office immediately for further recommendations for treatment.   ITCHING:  If you experience itching with your medications, try taking only a single pain pill, or even half a pain pill at a time.  You can also use Benadryl over the counter for itching or also to help with sleep.   TED HOSE STOCKINGS:  Use stockings on both legs until for at least 2 weeks or as directed by physician office. They may be removed at night for sleeping.  MEDICATIONS:  See your medication  summary on the After Visit Summary that nursing will review with you.  You may have some home medications which will be placed on hold until you complete the course of blood thinner medication.  It is important for you to complete the blood thinner medication as prescribed.  PRECAUTIONS:  If you experience chest pain or shortness of breath - call 911 immediately for transfer to the hospital emergency department.   If you develop a fever greater that 101 F, purulent drainage from wound, increased redness or drainage from wound, foul odor from the wound/dressing, or calf pain - CONTACT YOUR SURGEON.                                                   FOLLOW-UP APPOINTMENTS:  If you do not already have a post-op appointment, please call the office for an appointment to be seen by your surgeon.  Guidelines for how soon to be seen are listed in your After Visit Summary, but are typically between 1-4 weeks after surgery.  MAKE SURE YOU:   Understand these instructions.   Get help right away if you are not doing well or get worse.    Thank you for letting us be a part of your medical care team.  It is a privilege we respect greatly.  We hope these instructions will help you stay on track for a fast and full recovery!

## 2014-09-20 NOTE — Progress Notes (Signed)
[facility] PROGRESS NOTE  Isaac Haynes WUJ:[id number] DOB: 08-02-1935 DOA: 09/17/2014 PCP: Tomi Bamberger, NP   Brief narrative 79 year old male with history of diabetes presented to the ED from home after sustaining a fall. No loss of consciousness or head injury sustained. Patient was found to have a mildly displaced proximal right femoral neck fracture and underwent right hemiarthroplasty on 8/26.   Assessment/Plan: Closed right hip fracture - secondary to mechanical fall.  -pain control with prn vicodin and morphine.  robaxin for muscle spasms.  bowel regimen. -Right hemiarthroplasty done on 8/26. Tolerated well. Aspirin for DVT prophylaxis. Up with therapy. Foley discontinued.  -Physical therapy recommended skilled nursing facility. On my discussion with him this morning he agrees to go to SNF. Spoke with patient's son who agrees for the same.   Active Problems:  Frontal headache and blurred vision  patient reports having frontal headache and occasional blurred vision since admission. Head CT done yesterday was unremarkable. Still has off-and-on frontal headache and some blurred vision. Blood pressure stable. Will monitor for now. If persists will need ophthalmology follow-up as outpatient.   Diabetes mellitus Holding metformin. . continue SSI   Right sided chest pain appears musculoskeletal from fall. No rib fractures seen on CXR. Resolved  with pain meds.  Diet:cardiac. NPO   DVT prophylaxis: sq lovenox  Code Status: full  Family Communication: spoke with son Alinda Money on the phone isposition PSkilled nursing facility as early as 8/29   Consultants:  Dr Eulah Pont  Procedures:   rt hip hemiarthroplasty on 8/26  Antibiotics:  none  HPI/Subjective reports his hip pain to be better. Still has off-and-on frontal headache and blurred vision  Objective: Filed Vitals:   09/20/14 0537  BP: 122/61  Pulse: 93  Temp: 98.1 F (36.7 C)  Resp: 18    Intake/Output  Summary (Last 24 hours) at 09/20/14 1126 Last data filed at 09/20/14 1030  Gross per 24 hour  Intake    723 ml  Output    800 ml  Net    -77 ml   Filed Weights   09/17/14 1323  Weight: 90.719 kg (200 lb)    Exam:  GEN: NAD HEENT:  moist oral mucosa, Cardiovascular: RRR, S1 normal, S2 normal, no murmurs  chest: clear bilaterally Abdominal: Soft. Non-tender, non-distended, bowel sounds are normal, Ext: warm, clean dressing over right hip   Data Reviewed: Basic Metabolic Panel:  Recent Labs Lab 09/17/14 1558  NA 137  K 3.7  CL 104  CO2 21*  GLUCOSE 261*  BUN 16  CREATININE 0.91  CALCIUM 9.1   Liver Function Tests: No results for input(s): AST, ALT, ALKPHOS, BILITOT, PROT, ALBUMIN in the last 168 hours. No results for input(s): LIPASE, AMYLASE in the last 168 hours. No results for input(s): AMMONIA in the last 168 hours. CBC:  Recent Labs Lab 09/17/14 1558 09/18/14 0417 09/19/14 0344  WBC 12.9* 10.8* 10.6*  NEUTROABS 10.1*  --   --   HGB 14.0 12.7* 11.5*  HCT 39.7 35.9* 33.7*  MCV 86.9 86.1 87.8  PLT 182 185 164   Cardiac Enzymes: No results for input(s): CKTOTAL, CKMB, CKMBINDEX, TROPONINI in the last 168 hours. BNP (last 3 results) No results for input(s): BNP in the last 8760 hours.  ProBNP (last 3 results) No results for input(s): PROBNP in the last 8760 hours.  CBG:  Recent Labs Lab 09/19/14 0630 09/19/14 1157 09/19/14 1556 09/19/14 2107 09/20/14 0616  GLUCAP 289* 282* 316* 241* 235*    Recent  Results (from the past 240 hour(s))  Surgical pcr screen     Status: None   Collection Time: 09/18/14  5:04 AM  Result Value Ref Range Status   MRSA, PCR NEGATIVE NEGATIVE Final   Staphylococcus aureus NEGATIVE NEGATIVE Final    Comment:        The Xpert SA Assay (FDA approved for NASAL specimens in patients over 37 years of age), is one component of a comprehensive surveillance program.  Test performance has been validated by [facility]  for patients greater than or equal to 8 year old. It is not intended to diagnose infection nor to guide or monitor treatment.      Studies: Ct Head Wo Contrast  09/19/2014   CLINICAL DATA:  79 year old male with headache.  EXAM: CT HEAD WITHOUT CONTRAST  TECHNIQUE: Contiguous axial images were obtained from the base of the skull through the vertex without intravenous contrast.  COMPARISON:  CT of the head 01/19/2015.  FINDINGS: Physiologic calcifications are noted in the basal ganglia bilaterally, and in the cerebellar hemispheres bilaterally, unchanged compared to prior study 01/18/2013. Mild cerebral atrophy. Patchy and confluent areas of decreased attenuation are noted throughout the deep and periventricular white matter of the cerebral hemispheres bilaterally, compatible with chronic microvascular ischemic disease. No acute intracranial abnormalities. Specifically, no evidence of acute intracranial hemorrhage, no definite findings of acute/subacute cerebral ischemia, no mass, mass effect, hydrocephalus or abnormal intra or extra-axial fluid collections. Visualized paranasal sinuses and mastoids are well pneumatized. No acute displaced skull fractures are identified.  IMPRESSION: 1. No acute intracranial abnormalities. 2. Mild cerebral atrophy with chronic microvascular ischemic changes in the cerebral white matter.   Electronically Signed   By: Trudie Reed M.D.   On: 09/19/2014 13:51   Pelvis Portable  09/18/2014   CLINICAL DATA:  Post RIGHT hip arthroplasty  EXAM: PORTABLE PELVIS 1-2 VIEWS  COMPARISON:  Portable exam 1636 hours compared to abdominal radiograph 08/06/2005  FINDINGS: RIGHT hip prosthesis in expected position on single AP view.  No fracture dislocation.  Visualized portions of pelvis appear intact, superior aspects excluded.  IMPRESSION: RIGHT hip prosthesis without acute complication.   Electronically Signed   By: Ulyses Southward M.D.   On: 09/18/2014 16:38    Scheduled Meds: .  aspirin EC  325 mg Oral Q breakfast  . enoxaparin (LOVENOX) injection  40 mg Subcutaneous Q24H  . insulin aspart  0-15 Units Subcutaneous TID WC  . insulin aspart  0-5 Units Subcutaneous QHS  . sodium chloride  3 mL Intravenous Q12H   Continuous Infusions: . sodium chloride 75 mL/hr at 09/19/14 0546  . lactated ringers 50 mL/hr at 09/18/14 1329      Time spent: 20 minutes    Eddie North  [facility] Pager [phone]. If 7PM-7AM, please contact night-coverage at [email or web address], password [facility] 09/20/2014, 11:26 AM  LOS: 3 days

## 2014-09-20 NOTE — Clinical Social Work Note (Signed)
Per MD, patient now agreeable to SNF placement. FL-2 completed and clinicals faxed to SNF's in [facility]. Patient has Medicare part A only. FL-2 to be placed on chart for MD signature.   CSW remains available as needed.   Derenda Fennel, MSW, LCSWA [phone] 09/20/2014 4:22 PM

## 2014-09-21 ENCOUNTER — Encounter (HOSPITAL_COMMUNITY): Payer: Self-pay | Admitting: Orthopedic Surgery

## 2014-09-21 DIAGNOSIS — H538 Other visual disturbances: Secondary | ICD-10-CM

## 2014-09-21 LAB — GLUCOSE, CAPILLARY
GLUCOSE-CAPILLARY: 240 mg/dL — AB (ref 65–99)
Glucose-Capillary: 198 mg/dL — ABNORMAL HIGH (ref 65–99)

## 2014-09-21 MED ORDER — SENNOSIDES-DOCUSATE SODIUM 8.6-50 MG PO TABS: 1.0000 | ORAL_TABLET | Freq: Two times a day (BID) | ORAL | Status: DC | PRN

## 2014-09-21 MED ORDER — SENNOSIDES-DOCUSATE SODIUM 8.6-50 MG PO TABS
1.0000 | ORAL_TABLET | Freq: Two times a day (BID) | ORAL | Status: DC
  Administered 2014-09-21: 1 via ORAL
  Filled 2014-09-21: qty 1

## 2014-09-21 NOTE — Progress Notes (Signed)
     Subjective:  POD#3 R hip hemiarthroplasty. Patient reports pain as mild.  Resting comfortably in bed this morning.  PT worked with him yesterday and recommends SNF at discharge.   Objective:   VITALS:   Filed Vitals:   09/19/14 1937 09/20/14 0537 09/20/14 1943 09/21/14 0438  BP: 151/63 122/61 124/57 113/58  Pulse: 123 93 106 98  Temp: 98.6 F (37 C) 98.1 F (36.7 C) 98.5 F (36.9 C) 99.1 F (37.3 C)  TempSrc: Oral Oral Oral Oral  Resp: Height:      Weight:      SpO2: 99% 100% 93% 93%    Neurologically intact ABD soft Neurovascular intact Sensation intact distally Intact pulses distally Dorsiflexion/Plantar flexion intact Incision: dressing C/D/I   Lab Results  Component Value Date   WBC 10.6* 09/19/2014   HGB 11.5* 09/19/2014   HCT 33.7* 09/19/2014   MCV 87.8 09/19/2014   PLT 164 09/19/2014   BMET    Component Value Date/Time   NA 137 09/17/2014 1558   K 3.7 09/17/2014 1558   CL 104 09/17/2014 1558   CO2 21* 09/17/2014 1558   GLUCOSE 261* 09/17/2014 1558   BUN 16 09/17/2014 1558   CREATININE 0.91 09/17/2014 1558   CALCIUM 9.1 09/17/2014 1558   GFRNONAA >60 09/17/2014 1558   GFRAA >60 09/17/2014 1558     Assessment/Plan: 3 Days Post-Op   Principal Problem:   Closed right hip fracture Active Problems:   DM2 (diabetes mellitus, type 2)   Up with therapy WBAT in the RLE with Posterior hip precautions. OK from ortho standpoint to be transferred to facility for rehab.   ASA  daily for 30 days for DVT prophylaxis   Sarah Baez Marie 09/21/2014, 7:50 AM Cell [phone]

## 2014-09-21 NOTE — Care Management Important Message (Signed)
Important Message  Patient Details  Name: Isaac Haynes MRN: [id number] Date of Birth: 1935-10-07   Medicare Important Message Given:  Yes-second notification given    Orson Aloe 09/21/2014, 11:31 AM

## 2014-09-21 NOTE — Discharge Summary (Signed)
Physician Discharge Summary  Haik Mahoney UYQ:[id number] DOB: 02-01-1935 DOA: 09/17/2014  PCP: Delia Chimes, NP  Admit date: 09/17/2014 Discharge date: 09/21/2014  Time spent: 30 minutes  Recommendations for Outpatient Follow-up:  1. Discharge skilled nursing facility. Follow-up with orthopedics as outpatient. 2. Patient has persistent blurred vision and headache niece to follow-up with ophthalmology as outpatient  Discharge Diagnoses:  Principal Problem:   Closed right hip fracture   Active Problems:   DM2 (diabetes mellitus, type 2)   Blurred vision, bilateral   Discharge Condition: Fair  Diet recommendation: Diabetic  Filed Weights   09/17/14 1323  Weight: 90.719 kg (200 lb)    History of present illness:  Please refer to admission H&P for details, in brief, 79 year old mael with hx of DM presented from home after sustaining a fall. He was gettign out oof the house and was trying to hold the side rails in front of the door but thinks he may have missed a step and fell on the floor . He blanked out for a second before landing on the floor. Denies passing out or hitting his head. He thinks he landed on his right side ( hip and chest and started having pain). patient brought to the ED. Vitals were stable.  Blood work showed mild leucocytosis, normal H&h and platelets. Normal chemistry except Blood glucose of 261. Xray of right hip showed mildly displaced proximal right femoral neck fracture.  CXR was unremarkable. EKG showed LAD which was unchanged from prior.  Pt c/o rt hip and rt lateral chest pain.  Orthopedics surgery consulted who plan on rt hip arthroplasty on 8/26 at 1:30 pm Patient denies headache, dizziness, fever, chills, nausea , vomiting, chest pain, palpitations, SOB, abdominal pain, bowel or urinary symptoms. Denies change in weight or appetite.   Hospital Course:  Closed right hip fracture - secondary to mechanical fall.  -pain control with prn  vicodin . Added bowel regimen. -Right hemiarthroplasty done on 8/26. Tolerated well. Orthopedics recommends full dose aspirin (25 mg daily) for DVT prophylaxis. Seen by physical therapy who recommended skilled nursing facility.   Active Problems:  Frontal headache and blurred vision patient reports having frontal headache and occasional blurred vision since admission. Head CT done  was unremarkable. Complains of mild frontal headache and occasional blurred vision (1-2 times a day). If symptoms persist he will need outpatient ophthalmology referral.  Diabetes mellitus Resume metformin   Right sided chest pain appears musculoskeletal from fall. No rib fractures seen on CXR. Resolved with pain meds.  CODE STATUS: Full code  Family communication: Spoke with son Nicole Kindred on the phone on 8/28  Disposition: Skilled nursing facility  Procedures:  Right hip arthroplasty on 8/26  Consultations:  Orthopedics Edmonia Lynch)  Discharge Exam: Danley Danker Vitals:   09/21/14 0438  BP: 113/58  Pulse: 98  Temp: 99.1 F (37.3 C)  Resp: 17    General: Elderly male in no acute distress HEENT: No pallor, moist oral mucosa, supple neck Cardiovascular: S1 and S2, no murmurs rub or gallop Respiratory: Her to auscultation bilaterally GI: Soft, nondistended, nontender, bowel sounds present Musculoskeletal: Warm, clean dressing lower right hip CNS: Alert and oriented  Discharge Instructions    Current Discharge Medication List    START taking these medications   Details  HYDROcodone-acetaminophen (NORCO) 5-325 MG per tablet Take 1-2 tablets by mouth every 4 (four) hours as needed for moderate pain. Qty: 90 tablet, Refills: 0    senna-docusate (SENOKOT-S) 8.6-50 MG per tablet Take 1  tablet by mouth 2 (two) times daily between meals as needed for mild constipation. Qty: 15 tablet, Refills: 0      CONTINUE these medications which have CHANGED   Details  aspirin EC 325 MG tablet Take 1  tablet (325 mg total) by mouth daily. Qty: 30 tablet, Refills: 0      CONTINUE these medications which have NOT CHANGED   Details  albuterol (PROVENTIL HFA;VENTOLIN HFA) 108 (90 BASE) MCG/ACT inhaler Inhale 2 puffs into the lungs every 6 (six) hours as needed for wheezing or shortness of breath. Qty: 1 Inhaler, Refills: 11    albuterol (PROVENTIL) (5 MG/ML) 0.5% nebulizer solution Take 0.5 mLs (2.5 mg total) by nebulization every 4 (four) hours as needed for wheezing or shortness of breath. Qty: 20 mL, Refills: 12    Blood Glucose Monitoring Suppl (ONE TOUCH ULTRA SYSTEM KIT) W/DEVICE KIT 1 kit by Does not apply route once. Qty: 1 each, Refills: 0    metFORMIN (GLUCOPHAGE) 1000 MG tablet Take 1 tablet (1,000 mg total) by mouth 2 (two) times daily with a meal. Qty: 60 tablet, Refills: 3      STOP taking these medications     Chlorphen-Pseudoephed-APAP (CORICIDIN D PO)        No Known Allergies Follow-up Information    Follow up with MURPHY, TIMOTHY D, MD In 1 week.   Specialty:  Orthopedic Surgery   Contact information:   [address] [place] [address] [phone]       Please follow up.   Why:  MD at SNF       The results of significant diagnostics from this hospitalization (including imaging, microbiology, ancillary and laboratory) are listed below for reference.    Significant Diagnostic Studies: Dg Chest 2 View  09/17/2014   CLINICAL DATA:  Hip pain post fall, diabetes mellitus  EXAM: CHEST  2 VIEW  COMPARISON:  01/19/2013  FINDINGS: Upper normal heart size.  Mediastinal contours and pulmonary vascularity normal.  Lungs clear.  No pleural effusion or pneumothorax.  No acute osseous findings.  IMPRESSION: No acute abnormalities.   Electronically Signed   By: Lavonia Dana M.D.   On: 09/17/2014 14:56   Ct Head Wo Contrast  09/19/2014   CLINICAL DATA:  79 year old male with headache.  EXAM: CT HEAD WITHOUT CONTRAST  TECHNIQUE: Contiguous axial  images were obtained from the base of the skull through the vertex without intravenous contrast.  COMPARISON:  CT of the head 01/19/2015.  FINDINGS: Physiologic calcifications are noted in the basal ganglia bilaterally, and in the cerebellar hemispheres bilaterally, unchanged compared to prior study 01/18/2013. Mild cerebral atrophy. Patchy and confluent areas of decreased attenuation are noted throughout the deep and periventricular white matter of the cerebral hemispheres bilaterally, compatible with chronic microvascular ischemic disease. No acute intracranial abnormalities. Specifically, no evidence of acute intracranial hemorrhage, no definite findings of acute/subacute cerebral ischemia, no mass, mass effect, hydrocephalus or abnormal intra or extra-axial fluid collections. Visualized paranasal sinuses and mastoids are well pneumatized. No acute displaced skull fractures are identified.  IMPRESSION: 1. No acute intracranial abnormalities. 2. Mild cerebral atrophy with chronic microvascular ischemic changes in the cerebral white matter.   Electronically Signed   By: Vinnie Langton M.D.   On: 09/19/2014 13:51   Pelvis Portable  09/18/2014   CLINICAL DATA:  Post RIGHT hip arthroplasty  EXAM: PORTABLE PELVIS 1-2 VIEWS  COMPARISON:  Portable exam 1636 hours compared to abdominal radiograph 08/06/2005  FINDINGS: RIGHT  hip prosthesis in expected position on single AP view.  No fracture dislocation.  Visualized portions of pelvis appear intact, superior aspects excluded.  IMPRESSION: RIGHT hip prosthesis without acute complication.   Electronically Signed   By: Lavonia Dana M.D.   On: 09/18/2014 16:38   Dg Hip Unilat With Pelvis 2-3 Views Right  09/17/2014   CLINICAL DATA:  Acute right hip pain after fall.  Initial encounter.  EXAM: DG HIP (WITH OR WITHOUT PELVIS) 2-3V RIGHT  COMPARISON:  None.  FINDINGS: Mildly displaced proximal right femoral neck fracture is noted. This appears closed and posttraumatic.  Femoral head remains position within the acetabulum.  IMPRESSION: Mildly displaced proximal right femoral neck fracture.   Electronically Signed   By: Marijo Conception, M.D.   On: 09/17/2014 14:55    Microbiology: Recent Results (from the past 240 hour(s))  Surgical pcr screen     Status: None   Collection Time: 09/18/14  5:04 AM  Result Value Ref Range Status   MRSA, PCR NEGATIVE NEGATIVE Final   Staphylococcus aureus NEGATIVE NEGATIVE Final    Comment:        The Xpert SA Assay (FDA approved for NASAL specimens in patients over 59 years of age), is one component of a comprehensive surveillance program.  Test performance has been validated by [facility] for patients greater than or equal to 53 year old. It is not intended to diagnose infection nor to guide or monitor treatment.      Labs: Basic Metabolic Panel:  Recent Labs Lab 09/17/14 1558  NA 137  K 3.7  CL 104  CO2 21*  GLUCOSE 261*  BUN 16  CREATININE 0.91  CALCIUM 9.1   Liver Function Tests: No results for input(s): AST, ALT, ALKPHOS, BILITOT, PROT, ALBUMIN in the last 168 hours. No results for input(s): LIPASE, AMYLASE in the last 168 hours. No results for input(s): AMMONIA in the last 168 hours. CBC:  Recent Labs Lab 09/17/14 1558 09/18/14 0417 09/19/14 0344  WBC 12.9* 10.8* 10.6*  NEUTROABS 10.1*  --   --   HGB 14.0 12.7* 11.5*  HCT 39.7 35.9* 33.7*  MCV 86.9 86.1 87.8  PLT 182 185 164   Cardiac Enzymes: No results for input(s): CKTOTAL, CKMB, CKMBINDEX, TROPONINI in the last 168 hours. BNP: BNP (last 3 results) No results for input(s): BNP in the last 8760 hours.  ProBNP (last 3 results) No results for input(s): PROBNP in the last 8760 hours.  CBG:  Recent Labs Lab 09/20/14 0616 09/20/14 1154 09/20/14 1638 09/20/14 2138 09/21/14 0620  GLUCAP 235* 247* 293* 251* 198*       Signed:  Hodge Stachnik  [facility] 09/21/2014, 9:30 AM

## 2014-09-21 NOTE — Clinical Social Work Placement (Signed)
   CLINICAL SOCIAL WORK PLACEMENT  NOTE  Date:  09/21/2014  Patient Details  Name: Isaac Haynes MRN: [id number] Date of Birth: 12/26/1935  Clinical Social Work is seeking post-discharge placement for this patient at the Skilled  Nursing Facility level of care (*CSW will initial, date and re-position this form in  chart as items are completed):  Yes   Patient/family provided with [place] Clinical Social Work Department's list of facilities offering this level of care within the geographic area requested by the patient (or if unable, by the patient's family).  Yes   Patient/family informed of their freedom to choose among providers that offer the needed level of care, that participate in Medicare, Medicaid or managed care program needed by the patient, have an available bed and are willing to accept the patient.  Yes   Patient/family informed of 's ownership interest in [facility] and [facility], as well as of the fact that they are under no obligation to receive care at these facilities.  PASRR submitted to EDS on 09/20/14     PASRR number received on 09/20/14     Existing PASRR number confirmed on       FL2 transmitted to all facilities in geographic area requested by pt/family on 09/20/14     FL2 transmitted to all facilities within larger geographic area on       Patient informed that his/her managed care company has contracts with or will negotiate with certain facilities, including the following:            Patient/family informed of bed offers received.  Patient chooses bed at [facility]     Physician recommends and patient chooses bed at      Patient to be transferred to [facility] on 09/21/14.  Patient to be transferred to facility by [facility]     Patient family notified on 09/21/14 of transfer.  Name of family member notified:  patient is alert and oriented x4 updated family as appropriate     PHYSICIAN Please sign FL2, Please  prepare priority discharge summary, including medications     Additional Comment:    _______________________________________________ Rondel Baton, LCSW 09/21/2014, 12:55 PM

## 2014-09-21 NOTE — Discharge Planning (Signed)
Patient will discharge today per MD order. Patient will discharge to: [facility]  RN to call report prior to transportation to: [phone] Transportation: [facility]  CSW sent discharge summary to SNF for review.  Packet is complete.  RN, patient and family aware of discharge plans.  Vickii Penna, LCSWA [phone]  Psychiatric & Orthopedics (5N 1-16) Clinical Social Worker

## 2014-11-26 ENCOUNTER — Encounter: Payer: Self-pay | Admitting: Family Medicine

## 2014-11-26 ENCOUNTER — Ambulatory Visit (INDEPENDENT_AMBULATORY_CARE_PROVIDER_SITE_OTHER): Payer: Medicaid Other | Admitting: Family Medicine

## 2014-11-26 ENCOUNTER — Telehealth: Payer: Self-pay | Admitting: Family Medicine

## 2014-11-26 VITALS — BP 114/67 | HR 97 | Temp 98.1°F | Ht 70.0 in | Wt 200.0 lb

## 2014-11-26 DIAGNOSIS — S72001D Fracture of unspecified part of neck of right femur, subsequent encounter for closed fracture with routine healing: Secondary | ICD-10-CM

## 2014-11-26 DIAGNOSIS — Z794 Long term (current) use of insulin: Secondary | ICD-10-CM

## 2014-11-26 DIAGNOSIS — E1165 Type 2 diabetes mellitus with hyperglycemia: Secondary | ICD-10-CM | POA: Diagnosis not present

## 2014-11-26 LAB — POCT GLYCOSYLATED HEMOGLOBIN (HGB A1C): HEMOGLOBIN A1C: 8.9

## 2014-11-26 LAB — GLUCOSE, POCT (MANUAL RESULT ENTRY): POC Glucose: 238 mg/dl — AB (ref 70–99)

## 2014-11-26 MED ORDER — ONETOUCH ULTRA SYSTEM W/DEVICE KIT: 1.0000 | PACK | Freq: Once | Status: AC

## 2014-11-26 NOTE — Patient Instructions (Addendum)
Check glucose before eating in the morning and again two hours after supper. Write down the results on the glucose log sheet. Bring the log with you to the next appointment 

## 2014-11-26 NOTE — Progress Notes (Signed)
Subjective:  Patient ID: Isaac Haynes, male    DOB: May 30, 1935  Age: 79 y.o. MRN: [id number]  CC: Establish Care and Diabetes   HPI Recent fx R hip, 2.5 mos ago. In rehab until 1 week ago. Still having some problems with ambulation and pain due to right hip fracture.   Taking insulin the last month.  Isaac Haynes presents forFollow-up of diabetes. Patient does not check blood sugar at home Patient denies symptoms such as polyuria, polydipsia, excessive hunger, nausea No significant hypoglycemic spells noted. Medications as noted below. Taking them regularly without complication/adverse reaction being reported today.   History Isaac Haynes has a past medical history of Diabetes mellitus without complication ([place]) and Kidney stones.   Isaac Haynes has past surgical history that includes Hip Arthroplasty (Right, 09/18/2014) and Tonsillectomy.   His family history includes Diabetes in his mother; Heart disease in his father and mother; Hypertension in his father and mother.Isaac Haynes reports that Isaac Haynes has never smoked. Isaac Haynes does not have any smokeless tobacco history on file. Isaac Haynes reports that Isaac Haynes drinks alcohol. Isaac Haynes reports that Isaac Haynes does not use illicit drugs.  Current Outpatient Prescriptions on File Prior to Visit  Medication Sig Dispense Refill  . metFORMIN (GLUCOPHAGE) 1000 MG tablet Take 1 tablet (1,000 mg total) by mouth 2 (two) times daily with a meal. 60 tablet 3   No current facility-administered medications on file prior to visit.    ROS Review of Systems  Constitutional: Negative for fever, chills and diaphoresis.  HENT: Negative for congestion, rhinorrhea and sore throat.   Respiratory: Negative for cough, shortness of breath and wheezing.   Cardiovascular: Negative for chest pain.  Gastrointestinal: Negative for nausea, vomiting, abdominal pain, diarrhea, constipation and abdominal distention.  Genitourinary: Negative for dysuria and frequency.  Musculoskeletal: Positive for arthralgias.  Skin:  Negative for rash.  Neurological: Negative for headaches.    Objective:  BP 114/67 mmHg  Pulse 97  Temp(Src) 98.1 F (36.7 C) (Oral)  Ht 5' 10" (1.778 m)  Wt 200 lb (90.719 kg)  BMI 28.70 kg/m2  BP Readings from Last 3 Encounters:  11/26/14 114/67  09/21/14 113/58  01/21/13 123/74    Wt Readings from Last 3 Encounters:  11/26/14 200 lb (90.719 kg)  09/17/14 200 lb (90.719 kg)  01/19/13 220 lb (99.791 kg)     Physical Exam  Constitutional: Isaac Haynes is oriented to person, place, and time. Isaac Haynes appears well-developed and well-nourished. No distress.  HENT:  Head: Normocephalic and atraumatic.  Right Ear: External ear normal.  Left Ear: External ear normal.  Nose: Nose normal.  Mouth/Throat: Oropharynx is clear and moist.  Eyes: Conjunctivae and EOM are normal. Pupils are equal, round, and reactive to light.  Neck: Normal range of motion. Neck supple. No thyromegaly present.  Cardiovascular: Normal rate, regular rhythm and normal heart sounds.   No murmur heard. Pulmonary/Chest: Effort normal and breath sounds normal. No respiratory distress. Isaac Haynes has no wheezes. Isaac Haynes has no rales.  Abdominal: Soft. Bowel sounds are normal. Isaac Haynes exhibits no distension. There is no tenderness.  Musculoskeletal: Isaac Haynes exhibits tenderness.  Lymphadenopathy:    Isaac Haynes has no cervical adenopathy.  Neurological: Isaac Haynes is alert and oriented to person, place, and time. Isaac Haynes has normal reflexes.  Skin: Skin is warm and dry. Rash noted.  Psychiatric: Isaac Haynes has a normal mood and affect. His behavior is normal. Judgment and thought content normal.    Lab Results  Component Value Date   HGBA1C 8.9 11/26/2014   HGBA1C 11.4* 01/19/2013  Lab Results  Component Value Date   WBC 10.6* 09/19/2014   HGB 11.5* 09/19/2014   HCT 33.7* 09/19/2014   PLT 164 09/19/2014   GLUCOSE 261* 09/17/2014   ALT 81* 01/18/2013   AST 96* 01/18/2013   NA 137 09/17/2014   K 3.7 09/17/2014   CL 104 09/17/2014   CREATININE 0.91 09/17/2014     BUN 16 09/17/2014   CO2 21* 09/17/2014   HGBA1C 8.9 11/26/2014     Assessment & Plan:   Stuart was seen today for establish care and diabetes.  Diagnoses and all orders for this visit:  Type 2 diabetes mellitus with hyperglycemia, with long-term current use of insulin (HCC) -     POCT glycosylated hemoglobin (Hb A1C) -     CBC with Differential/Platelet -     CMP14+EGFR -     POCT glucose (manual entry) -     Ambulatory referral to Physical Therapy  Closed right hip fracture, with routine healing, subsequent encounter -     Ambulatory referral to Physical Therapy  Other orders -     Blood Glucose Monitoring Suppl (ONE TOUCH ULTRA SYSTEM KIT) W/DEVICE KIT; 1 kit by Does not apply route once. -     sitaGLIPtin (JANUVIA) 100 MG tablet; Take 1 tablet (100 mg total) by mouth daily.   I have discontinued Mr. Brinegar's albuterol, albuterol, aspirin EC, HYDROcodone-acetaminophen, and senna-docusate. I am also having him start on sitaGLIPtin. Additionally, I am having him maintain his metFORMIN and ONE TOUCH ULTRA SYSTEM KIT.  Meds ordered this encounter  Medications  . Blood Glucose Monitoring Suppl (ONE TOUCH ULTRA SYSTEM KIT) W/DEVICE KIT    Sig: 1 kit by Does not apply route once.    Dispense:  1 each    Refill:  0  . sitaGLIPtin (JANUVIA) 100 MG tablet    Sig: Take 1 tablet (100 mg total) by mouth daily.    Dispense:  30 tablet    Refill:  2     Follow-up: Return in about 6 weeks (around 01/07/2015) for diabetes.  Claretta Fraise, M.D.

## 2014-11-27 ENCOUNTER — Telehealth: Payer: Self-pay | Admitting: Family Medicine

## 2014-11-27 NOTE — Telephone Encounter (Signed)
Stp and advised i couldn't see where we had called him.

## 2014-11-29 MED ORDER — SITAGLIPTIN PHOSPHATE 100 MG PO TABS: 100.0000 mg | ORAL_TABLET | Freq: Every day | ORAL | Status: AC

## 2014-11-29 NOTE — Telephone Encounter (Signed)
I wasn't sure what urinating from me, so I tweaked the note and put in a written physical therapy referral. If anything else is needed please let me know. Next, WS.

## 2014-12-02 NOTE — Telephone Encounter (Signed)
Spoke to [employer] [facility], they just need visit notes faxed. Visit notes printed and faxed as requested.

## 2014-12-07 ENCOUNTER — Telehealth: Payer: Self-pay

## 2014-12-07 NOTE — Telephone Encounter (Signed)
Medicaid approved Januvia 100  Mg for 30 days then patient is eligible for Medicare Part D   [id number]

## 2014-12-22 ENCOUNTER — Telehealth: Payer: Self-pay | Admitting: Family Medicine

## 2014-12-23 ENCOUNTER — Ambulatory Visit (INDEPENDENT_AMBULATORY_CARE_PROVIDER_SITE_OTHER): Payer: Medicaid Other | Admitting: Family Medicine

## 2014-12-23 DIAGNOSIS — S72001D Fracture of unspecified part of neck of right femur, subsequent encounter for closed fracture with routine healing: Secondary | ICD-10-CM | POA: Diagnosis not present

## 2014-12-23 DIAGNOSIS — Z96641 Presence of right artificial hip joint: Secondary | ICD-10-CM | POA: Diagnosis not present

## 2014-12-23 DIAGNOSIS — Z7984 Long term (current) use of oral hypoglycemic drugs: Secondary | ICD-10-CM | POA: Diagnosis not present

## 2014-12-23 DIAGNOSIS — E119 Type 2 diabetes mellitus without complications: Secondary | ICD-10-CM

## 2014-12-23 NOTE — Telephone Encounter (Signed)
Office Notes faxed.

## 2015-01-05 ENCOUNTER — Ambulatory Visit: Payer: Medicare Other | Admitting: Family Medicine

## 2015-08-30 ENCOUNTER — Emergency Department: Payer: Medicare Other

## 2015-08-30 ENCOUNTER — Inpatient Hospital Stay
Admission: EM | Admit: 2015-08-30 | Discharge: 2015-09-01 | DRG: 280 | Disposition: A | Discharge status: Other Acute Inpatient Hospital | Payer: Medicare Other | Attending: Internal Medicine | Admitting: Internal Medicine

## 2015-08-30 ENCOUNTER — Encounter: Payer: Self-pay | Admitting: Emergency Medicine

## 2015-08-30 DIAGNOSIS — Z87442 Personal history of urinary calculi: Secondary | ICD-10-CM | POA: Diagnosis not present

## 2015-08-30 DIAGNOSIS — L8990 Pressure ulcer of unspecified site, unspecified stage: Secondary | ICD-10-CM | POA: Insufficient documentation

## 2015-08-30 DIAGNOSIS — E119 Type 2 diabetes mellitus without complications: Secondary | ICD-10-CM | POA: Diagnosis present

## 2015-08-30 DIAGNOSIS — L89152 Pressure ulcer of sacral region, stage 2: Secondary | ICD-10-CM | POA: Diagnosis present

## 2015-08-30 DIAGNOSIS — Z833 Family history of diabetes mellitus: Secondary | ICD-10-CM | POA: Diagnosis not present

## 2015-08-30 DIAGNOSIS — N179 Acute kidney failure, unspecified: Secondary | ICD-10-CM | POA: Diagnosis present

## 2015-08-30 DIAGNOSIS — I110 Hypertensive heart disease with heart failure: Secondary | ICD-10-CM | POA: Diagnosis present

## 2015-08-30 DIAGNOSIS — I5033 Acute on chronic diastolic (congestive) heart failure: Secondary | ICD-10-CM | POA: Diagnosis present

## 2015-08-30 DIAGNOSIS — Z96641 Presence of right artificial hip joint: Secondary | ICD-10-CM | POA: Diagnosis present

## 2015-08-30 DIAGNOSIS — Z8249 Family history of ischemic heart disease and other diseases of the circulatory system: Secondary | ICD-10-CM

## 2015-08-30 DIAGNOSIS — Z91128 Patient's intentional underdosing of medication regimen for other reason: Secondary | ICD-10-CM | POA: Diagnosis not present

## 2015-08-30 DIAGNOSIS — I214 Non-ST elevation (NSTEMI) myocardial infarction: Secondary | ICD-10-CM | POA: Diagnosis present

## 2015-08-30 DIAGNOSIS — I509 Heart failure, unspecified: Secondary | ICD-10-CM

## 2015-08-30 DIAGNOSIS — T383X6A Underdosing of insulin and oral hypoglycemic [antidiabetic] drugs, initial encounter: Secondary | ICD-10-CM | POA: Diagnosis present

## 2015-08-30 DIAGNOSIS — T79A3XA Traumatic compartment syndrome of abdomen, initial encounter: Secondary | ICD-10-CM

## 2015-08-30 DIAGNOSIS — R0600 Dyspnea, unspecified: Secondary | ICD-10-CM | POA: Diagnosis present

## 2015-08-30 LAB — CBC
HEMATOCRIT: 42 % (ref 40.0–52.0)
Hemoglobin: 14.4 g/dL (ref 13.0–18.0)
MCH: 30.8 pg (ref 26.0–34.0)
MCHC: 34.2 g/dL (ref 32.0–36.0)
MCV: 90.3 fL (ref 80.0–100.0)
PLATELETS: 216 10*3/uL (ref 150–440)
RBC: 4.65 MIL/uL (ref 4.40–5.90)
RDW: 14.5 % (ref 11.5–14.5)
WBC: 12.1 10*3/uL — AB (ref 3.8–10.6)

## 2015-08-30 LAB — PROTIME-INR
INR: 1.12
PROTHROMBIN TIME: 14.5 s (ref 11.4–15.2)

## 2015-08-30 LAB — BASIC METABOLIC PANEL
Anion gap: 11 (ref 5–15)
BUN: 26 mg/dL — AB (ref 6–20)
CHLORIDE: 101 mmol/L (ref 101–111)
CO2: 23 mmol/L (ref 22–32)
CREATININE: 1.39 mg/dL — AB (ref 0.61–1.24)
Calcium: 9.3 mg/dL (ref 8.9–10.3)
GFR calc Af Amer: 54 mL/min — ABNORMAL LOW (ref >60)
GFR, EST NON AFRICAN AMERICAN: 47 mL/min — AB (ref >60)
GLUCOSE: 387 mg/dL — AB (ref 65–99)
POTASSIUM: 4.4 mmol/L (ref 3.5–5.1)
SODIUM: 135 mmol/L (ref 135–145)

## 2015-08-30 LAB — TROPONIN I
TROPONIN I: 0.12 ng/mL — AB (ref <0.03)
Troponin I: 0.12 ng/mL (ref <0.03)

## 2015-08-30 LAB — BRAIN NATRIURETIC PEPTIDE: B Natriuretic Peptide: 921 pg/mL — ABNORMAL HIGH (ref 0.0–100.0)

## 2015-08-30 LAB — URINALYSIS COMPLETE WITH MICROSCOPIC (ARMC ONLY)
BACTERIA UA: NONE SEEN
BILIRUBIN URINE: NEGATIVE
Ketones, ur: NEGATIVE mg/dL
LEUKOCYTES UA: NEGATIVE
Nitrite: NEGATIVE
Protein, ur: NEGATIVE mg/dL
RBC / HPF: NONE SEEN RBC/hpf (ref 0–5)
SQUAMOUS EPITHELIAL / LPF: NONE SEEN
Specific Gravity, Urine: 1.011 (ref 1.005–1.030)
WBC, UA: NONE SEEN WBC/hpf (ref 0–5)
pH: 5 (ref 5.0–8.0)

## 2015-08-30 LAB — HEPARIN LEVEL (UNFRACTIONATED): Heparin Unfractionated: <0.1 IU/mL — ABNORMAL LOW (ref 0.30–0.70)

## 2015-08-30 LAB — GLUCOSE, CAPILLARY: GLUCOSE-CAPILLARY: 531 mg/dL — AB (ref 65–99)

## 2015-08-30 LAB — APTT: APTT: 30 s (ref 24–36)

## 2015-08-30 LAB — TSH: TSH: 2.181 u[IU]/mL (ref 0.350–4.500)

## 2015-08-30 MED ORDER — INSULIN ASPART 100 UNIT/ML ~~LOC~~ SOLN
12.0000 [IU] | Freq: Once | SUBCUTANEOUS | Status: AC
  Administered 2015-08-30: 12 [IU] via SUBCUTANEOUS
  Filled 2015-08-30: qty 12

## 2015-08-30 MED ORDER — HEPARIN BOLUS VIA INFUSION
4000.0000 [IU] | Freq: Once | INTRAVENOUS | Status: AC
  Administered 2015-08-30: 4000 [IU] via INTRAVENOUS
  Filled 2015-08-30: qty 4000

## 2015-08-30 MED ORDER — SODIUM CHLORIDE 0.9% FLUSH
3.0000 mL | Freq: Two times a day (BID) | INTRAVENOUS | Status: DC
  Administered 2015-08-30 – 2015-08-31 (×3): 3 mL via INTRAVENOUS

## 2015-08-30 MED ORDER — FUROSEMIDE 10 MG/ML IJ SOLN
40.0000 mg | Freq: Two times a day (BID) | INTRAMUSCULAR | Status: DC
  Administered 2015-08-30 – 2015-09-01 (×4): 40 mg via INTRAVENOUS
  Filled 2015-08-30 (×4): qty 4

## 2015-08-30 MED ORDER — IOPAMIDOL (ISOVUE-370) INJECTION 76%
75.0000 mL | Freq: Once | INTRAVENOUS | Status: AC | PRN
  Administered 2015-08-30: 75 mL via INTRAVENOUS

## 2015-08-30 MED ORDER — HEPARIN (PORCINE) IN NACL 100-0.45 UNIT/ML-% IJ SOLN
1100.0000 [IU]/h | INTRAMUSCULAR | Status: DC
  Administered 2015-08-30: 1200 [IU]/h via INTRAVENOUS
  Administered 2015-08-31 (×2): 1100 [IU]/h via INTRAVENOUS
  Filled 2015-08-30 (×6): qty 250

## 2015-08-30 MED ORDER — HEPARIN SODIUM (PORCINE) 5000 UNIT/ML IJ SOLN: 60.0000 [IU]/kg | Freq: Once | INTRAMUSCULAR | Status: DC

## 2015-08-30 MED ORDER — HEPARIN SODIUM (PORCINE) 5000 UNIT/ML IJ SOLN
INTRAMUSCULAR | Status: AC
Start: 2015-08-30 — End: 2015-08-31
  Filled 2015-08-30: qty 1

## 2015-08-30 MED ORDER — METOPROLOL TARTRATE 5 MG/5ML IV SOLN
5.0000 mg | Freq: Once | INTRAVENOUS | Status: AC
  Administered 2015-08-30: 5 mg via INTRAVENOUS
  Filled 2015-08-30: qty 5

## 2015-08-30 MED ORDER — CARVEDILOL 3.125 MG PO TABS
3.1250 mg | ORAL_TABLET | Freq: Two times a day (BID) | ORAL | Status: DC
  Administered 2015-08-30 – 2015-09-01 (×3): 3.125 mg via ORAL
  Filled 2015-08-30 (×4): qty 1

## 2015-08-30 MED ORDER — ASPIRIN 81 MG PO CHEW
324.0000 mg | CHEWABLE_TABLET | Freq: Once | ORAL | Status: AC
  Administered 2015-08-30: 324 mg via ORAL
  Filled 2015-08-30: qty 4

## 2015-08-30 MED ORDER — INSULIN ASPART 100 UNIT/ML ~~LOC~~ SOLN
0.0000 [IU] | Freq: Three times a day (TID) | SUBCUTANEOUS | Status: DC
  Administered 2015-08-31: 7 [IU] via SUBCUTANEOUS
  Administered 2015-08-31: 9 [IU] via SUBCUTANEOUS
  Administered 2015-09-01: 5 [IU] via SUBCUTANEOUS
  Administered 2015-09-01: 3 [IU] via SUBCUTANEOUS
  Administered 2015-09-01: 5 [IU] via SUBCUTANEOUS
  Filled 2015-08-30: qty 5
  Filled 2015-08-30: qty 9
  Filled 2015-08-30: qty 7

## 2015-08-30 NOTE — ED Triage Notes (Signed)
EMS called to residence for c/o generalized weakness and sob x 1 week.  Patient also c/o lower extremity swelling

## 2015-08-30 NOTE — ED Notes (Signed)
AAOx3.  Skin warm and dry.  NAD 

## 2015-08-30 NOTE — Consult Note (Signed)
ANTICOAGULATION CONSULT NOTE - Initial Consult  Pharmacy Consult for heparin drip Indication: chest pain/ACS  No Known Allergies  Patient Measurements: Height: 5\' 10"  (177.8 cm) IBW/kg (Calculated) : 73 Heparin Dosing Weight: 93.8  Vital Signs: Temp: 97.8 F (36.6 C) (08/07 1342) Temp Source: Oral (08/07 1342) BP: 112/79 (08/07 1342) Pulse Rate: 128 (08/07 1342)  Labs:  Recent Labs  08/30/15 1349  HGB 14.4  HCT 42.0  PLT 216  CREATININE 1.39*  TROPONINI 0.12*    CrCl cannot be calculated (Unknown ideal weight.).   Medical History: Past Medical History:  Diagnosis Date  . Diabetes mellitus without complication (HCC)   . Kidney stones     Medications:  Scheduled:  . heparin  4,000 Units Intravenous Once    Assessment: Pt is a 80 year old male who presents with chest pain and SOB. Elevated troponin. Pharmacy consulted to dose a heparin drip.   Goal of Therapy:  Heparin level 0.3-0.7 units/ml Monitor platelets by anticoagulation protocol: Yes   Plan:  Give 4000 units bolus x 1 Start heparin infusion at 1200 units/hr Check anti-Xa level in 8 hours and daily while on heparin Continue to monitor H&H and platelets  Isaac Haynes 08/30/2015,2:49 PM

## 2015-08-30 NOTE — H&P (Signed)
[place] [facility] [place] NAME: Isaac Haynes    MR#:  [id number]  DATE OF BIRTH:  [birth year]  DATE OF ADMISSION:  08/30/2015  PRIMARY CARE PHYSICIAN: No PCP Per Patient   REQUESTING/REFERRING PHYSICIAN: Lavonia Drafts MD  CHIEF COMPLAINT:   Chief Complaint  Patient presents with  . Weakness  . Shortness of Breath    HISTORY OF PRESENT ILLNESS: Isaac Haynes  is a 80 y.o. male with a known history of Diabetes type 2 who has not been taking any medications. He presents to the ED with complaint of shortness of breath, lower extremity swelling and chest pain. Patient reports that he has been having shortness of breath, progressively worse over one week. His noticed swelling of his lower extremities. He's also had nocturnal dyspnea or orthopnea. Patient also has intermittently been having left-sided sharp chest pains.  He has not had any fevers, chills, no nausea, vomiting or diarrhea. No radiation of the pain to his arms    PAST MEDICAL HISTORY:   Past Medical History:  Diagnosis Date  . Diabetes mellitus without complication ([place])   . Kidney stones     PAST SURGICAL HISTORY: Past Surgical History:  Procedure Laterality Date  . HIP ARTHROPLASTY Right 09/18/2014   Procedure: ARTHROPLASTY BIPOLAR HIP (HEMIARTHROPLASTY);  Surgeon: Renette Butters, MD;  Location: [facility];  Service: Orthopedics;  Laterality: Right;  . TONSILLECTOMY      SOCIAL HISTORY:  Social History  Substance Use Topics  . Smoking status: Never Smoker  . Smokeless tobacco: Never Used  . Alcohol use Yes     Comment: very little    FAMILY HISTORY:  Family History  Problem Relation Age of Onset  . Heart disease Mother   . Hypertension Mother   . Diabetes Mother   . Heart disease Father   . Hypertension Father     DRUG ALLERGIES: No Known Allergies  REVIEW OF SYSTEMS:   CONSTITUTIONAL: No fever,Positive fatigue and weakness.  EYES: No blurred or double  vision.  EARS, NOSE, AND THROAT: No tinnitus or ear pain.  RESPIRATORY: No cough, positive shortness of breath, wheezing or hemoptysis.  CARDIOVASCULAR: No chest pain, orthopnea, positive edema.  GASTROINTESTINAL: No nausea, vomiting, diarrhea or abdominal pain.  GENITOURINARY: No dysuria, hematuria.  ENDOCRINE: No polyuria, nocturia,  HEMATOLOGY: No anemia, easy bruising or bleeding SKIN: No rash or lesion. MUSCULOSKELETAL: No joint pain or arthritis.   NEUROLOGIC: No tingling, numbness, weakness.  PSYCHIATRY: No anxiety or depression.   MEDICATIONS AT HOME:  Prior to Admission medications   Medication Sig Start Date End Date Taking? Authorizing Provider  Blood Glucose Monitoring Suppl (ONE TOUCH ULTRA SYSTEM KIT) W/DEVICE KIT 1 kit by Does not apply route once. 11/26/14   Claretta Fraise, MD  metFORMIN (GLUCOPHAGE) 1000 MG tablet Take 1 tablet (1,000 mg total) by mouth 2 (two) times daily with a meal. Patient not taking: Reported on 08/30/2015 01/21/13   Theodis Blaze, MD  sitaGLIPtin (JANUVIA) 100 MG tablet Take 1 tablet (100 mg total) by mouth daily. Patient not taking: Reported on 08/30/2015 11/29/14   Claretta Fraise, MD      PHYSICAL EXAMINATION:   VITAL SIGNS: Blood pressure 115/83, pulse 100, temperature 97.8 F (36.6 C), temperature source Oral, resp. rate 18, height _0  (1.778 m), weight 100 kg (220 lb 6.4 oz), SpO2 100 %.  GENERAL:  80 y.o.-year-old patient lying in the bed with no acute distress.  EYES: Pupils  equal, round, reactive to light and accommodation. No scleral icterus. Extraocular muscles intact.  HEENT: Head atraumatic, normocephalic. Oropharynx and nasopharynx clear.  NECK:  Supple, no jugular venous distention. No thyroid enlargement, no tenderness.  LUNGS: Bilateral crackles and breath throughout both lung, no wheezing, Bilateral rales,rhonchi or crepitation. No use of accessory muscles of respiration.  CARDIOVASCULAR: S1, S2 normal. No murmurs, rubs, or  gallops.  ABDOMEN: Soft, nontender, nondistended. Bowel sounds present. No organomegaly or mass.  EXTREMITIES: 1+ pedal edema, cyanosis, or clubbing.  NEUROLOGIC: Cranial nerves II through XII are intact. Muscle strength 5/5 in all extremities. Sensation intact. Gait not checked.  PSYCHIATRIC: The patient is alert and oriented x 3.  SKIN: No obvious rash, lesion, or ulcer.   LABORATORY PANEL:   CBC  Recent Labs Lab 08/30/15 1349  WBC 12.1*  HGB 14.4  HCT 42.0  PLT 216  MCV 90.3  MCH 30.8  MCHC 34.2  RDW 14.5   ------------------------------------------------------------------------------------------------------------------  Chemistries   Recent Labs Lab 08/30/15 1349  NA 135  K 4.4  CL 101  CO2 23  GLUCOSE 387*  BUN 26*  CREATININE 1.39*  CALCIUM 9.3   ------------------------------------------------------------------------------------------------------------------ estimated creatinine clearance is 51.9 mL/min (by C-G formula based on SCr of 1.39 mg/dL). ------------------------------------------------------------------------------------------------------------------ No results for input(s): TSH, T4TOTAL, T3FREE, THYROIDAB in the last 72 hours.  Invalid input(s): FREET3   Coagulation profile  Recent Labs Lab 08/30/15 1349  INR 1.12   ------------------------------------------------------------------------------------------------------------------- No results for input(s): DDIMER in the last 72 hours. -------------------------------------------------------------------------------------------------------------------  Cardiac Enzymes  Recent Labs Lab 08/30/15 1349  TROPONINI 0.12*   ------------------------------------------------------------------------------------------------------------------ Invalid input(s): POCBNP  ---------------------------------------------------------------------------------------------------------------  Urinalysis No  results found for: COLORURINE, APPEARANCEUR, LABSPEC, PHURINE, GLUCOSEU, HGBUR, BILIRUBINUR, KETONESUR, PROTEINUR, UROBILINOGEN, NITRITE, LEUKOCYTESUR   RADIOLOGY: Dg Chest 2 View  Result Date: 08/30/2015 CLINICAL DATA:  Shortness of breath with legs and feet swelling for 2 weeks. EXAM: CHEST  2 VIEW COMPARISON:  09/17/2014. FINDINGS: The cardio pericardial silhouette is enlarged. There is pulmonary vascular congestion with interstitial pulmonary edema noted. No focal airspace consolidation. No substantial pleural effusion. Bones are diffusely demineralized. IMPRESSION: Cardiomegaly with interstitial pulmonary edema. Electronically Signed   By: Misty Stanley M.D.   On: 08/30/2015 14:42   Ct Angio Chest Pe W And/or Wo Contrast  Result Date: 08/30/2015 CLINICAL DATA:  80 year old male with shortness of breath for 1.5 weeks following episode of a kidney stone. Bilateral lower extremity swelling. Tachycardia. Initial encounter. EXAM: CT ANGIOGRAPHY CHEST WITH CONTRAST TECHNIQUE: Multidetector CT imaging of the chest was performed using the standard protocol during bolus administration of intravenous contrast. Multiplanar CT image reconstructions and MIPs were obtained to evaluate the vascular anatomy. CONTRAST:  75 mL Isovue 370 COMPARISON:  Chest radiographs 1428 hours today and earlier. FINDINGS: Good contrast bolus timing in the pulmonary arterial tree. No focal filling defect identified in the pulmonary arteries to suggest acute pulmonary embolism. Moderate left greater than right layering pleural effusions. Cardiomegaly with no pericardial effusion. Calcified coronary artery atherosclerosis. Reactive appearing mediastinal lymphadenopathy. There is calcified atherosclerosis of the visible abdominal aorta. Major airways are patent. There is bilateral pulmonary septal thickening. There is superimposed mild patchy perihilar peribronchial Negative visualized liver, spleen, adrenal glands, and bowel in the upper  abdomen. Osteopenia. No acute osseous abnormality identified. Opacity greater on the right. Review of the MIP images confirms the above findings. IMPRESSION: 1.  No evidence of acute pulmonary embolus. 2. Moderate layering pleural effusions with pulmonary septal thickening and cardiomegaly. Favor  Acute Pulmonary Edema. Mild right greater than left perihilar opacity also felt related to the edema, with main differential consideration of viral or atypical respiratory infection. 3. Calcified coronary artery and aortic atherosclerosis. Electronically Signed   By: Genevie Ann M.D.   On: 08/30/2015 16:31    EKG: Orders placed or performed during the hospital encounter of 08/30/15  . ED EKG  . ED EKG  . EKG 12-Lead  . EKG 12-Lead    IMPRESSION AND PLAN: Patient is a 80 year old white male presenting with chest pain and shortness of breath  1. Shortness of breath due to acute CHF Type of CHF, unknown. Will obtain echocardiogram of the heart I will start patient on IV Lasix twice a day Start him on [facility] evaluation  2. Chest pain, suspect due to unstable angina Troponin elevated. Follow serial enzymes Continue patient on heparin drip Aspirin therapy Start him on a lipid-lowering medication Cardiology consult  3. Diabetes type 2 Noncompliant with medications Hemoglobin A1c is 8.9 Sliding scale for now May be able to use oral medication to control his diabetes  4. Elevated creatinine Unknown baseline Possibly due to fluid overload  5. Misc: heparin   All the records are reviewed and case discussed with ED provider. Management plans discussed with the patient, family and they are in agreement.  CODE STATUS:    Code Status Orders        Start     Ordered   08/30/15 1700  Full code  Continuous     08/30/15 1701    Code Status History    Date Active Date Inactive Code Status Order ID Comments User Context   09/18/2014  5:09 PM 09/21/2014  6:28 PM Full Code [id number]   Renette Butters, MD Inpatient   09/17/2014  6:37 PM 09/18/2014  5:09 PM Full Code [id number]  Louellen Molder, MD Inpatient   01/19/2013 12:36 AM 01/21/2013  4:30 PM Full Code [id number]  Etta Quill, DO ED       TOTAL TIME TAKING CARE OF THIS PATIENT: 55 minutes.    Dustin Flock M.D on 08/30/2015 at 5:03 PM  Between 7am to 6pm - Pager - [phone]  After 6pm go to [email or web address] - password [email or web address] [facility]  [facility]  Office  [phone]  CC: Primary care physician; No PCP Per Patient

## 2015-08-30 NOTE — ED Provider Notes (Signed)
[facility] Emergency Department Provider Note    First MD Initiated Contact with Patient 08/30/15 1337     (approximate)  I have reviewed the triage vital signs and the nursing notes.   HISTORY  Chief Complaint Weakness and Shortness of Breath    HPI Isaac Haynes is a 80 y.o. male presents with several days of gradually worsening weakness, shortness of breath and right lower extremity swelling. This is new and the patient notes symptoms started within last several days while at home.The chest pain at this time but states that he's having trouble catching his breath. States he "just can't get any air in."  Denies any fevers, nausea or vomiting. Eyes any previous history of MI. Denies taking any anticoagulation. Patient states that he is status post right hip replacement.   Past Medical History:  Diagnosis Date  . Diabetes mellitus without complication ([place])   . Kidney stones     Patient Active Problem List   Diagnosis Date Noted  . Acute CHF (congestive heart failure) ([place]) 08/30/2015  . Blurred vision, bilateral 09/21/2014  . Closed right hip fracture ([place]) 09/17/2014  . DM2 (diabetes mellitus, type 2) ([place]) 01/19/2013    Past Surgical History:  Procedure Laterality Date  . HIP ARTHROPLASTY Right 09/18/2014   Procedure: ARTHROPLASTY BIPOLAR HIP (HEMIARTHROPLASTY);  Surgeon: Renette Butters, MD;  Location: [facility];  Service: Orthopedics;  Laterality: Right;  . TONSILLECTOMY      Prior to Admission medications   Medication Sig Start Date End Date Taking? Authorizing Provider  Blood Glucose Monitoring Suppl (ONE TOUCH ULTRA SYSTEM KIT) W/DEVICE KIT 1 kit by Does not apply route once. 11/26/14   Claretta Fraise, MD  metFORMIN (GLUCOPHAGE) 1000 MG tablet Take 1 tablet (1,000 mg total) by mouth 2 (two) times daily with a meal. Patient not taking: Reported on 08/30/2015 01/21/13   Theodis Blaze, MD  sitaGLIPtin (JANUVIA) 100 MG tablet Take 1 tablet (100 mg  total) by mouth daily. Patient not taking: Reported on 08/30/2015 11/29/14   Claretta Fraise, MD    Allergies Review of patient's allergies indicates no known allergies.  Family History  Problem Relation Age of Onset  . Heart disease Mother   . Hypertension Mother   . Diabetes Mother   . Heart disease Father   . Hypertension Father     Social History Social History  Substance Use Topics  . Smoking status: Never Smoker  . Smokeless tobacco: Never Used  . Alcohol use Yes     Comment: very little    Review of Systems Patient denies headaches, rhinorrhea, blurry vision, numbness, shortness of breath, chest pain, edema, cough, abdominal pain, nausea, vomiting, diarrhea, dysuria, fevers, rashes or hallucinations unless otherwise stated above in HPI. ____________________________________________   PHYSICAL EXAM:  VITAL SIGNS: Vitals:   08/30/15 1648 08/30/15 1706  BP: 115/83 (!) 119/96  Pulse: 100 (!) 101  Resp: 18 (!) 28  Temp:      Constitutional: Alert and oriented. Chronically ill and frail appearing. Eyes: Conjunctivae are normal. PERRL. EOMI. Head: Atraumatic. Nose: No congestion/rhinnorhea. Mouth/Throat: Mucous membranes are moist.  Oropharynx non-erythematous. Neck: No stridor. Painless ROM. No cervical spine tenderness to palpation Hematological/Lymphatic/Immunilogical: No cervical lymphadenopathy. Cardiovascular: Normal rate, regular rhythm. Grossly normal heart sounds.  Good peripheral circulation. Respiratory: tachypnic, inspiratory crackles bilaterally, no expiratory wheeze Gastrointestinal: Soft and nontender. No distention. No abdominal bruits. No CVA tenderness. Genitourinary:  Musculoskeletal: No lower extremity tenderness R>L LE edema No joint effusions.  Neurologic:  Normal speech and language. No gross focal neurologic deficits are appreciated. No gait instability. Skin:  Skin is warm, dry and intact. No rash  noted.  ____________________________________________   LABS (all labs ordered are listed, but only abnormal results are displayed)  Results for orders placed or performed during the hospital encounter of 08/30/15 (from the past 24 hour(s))  Basic metabolic panel     Status: Abnormal   Collection Time: 08/30/15  1:49 PM  Result Value Ref Range   Sodium 135 135 - 145 mmol/L   Potassium 4.4 3.5 - 5.1 mmol/L   Chloride 101 101 - 111 mmol/L   CO2 23 22 - 32 mmol/L   Glucose, Bld 387 (H) 65 - 99 mg/dL   BUN 26 (H) 6 - 20 mg/dL   Creatinine, Ser 1.39 (H) 0.61 - 1.24 mg/dL   Calcium 9.3 8.9 - 10.3 mg/dL   GFR calc non Af Amer 47 (L) >60 mL/min   GFR calc Af Amer 54 (L) >60 mL/min   Anion gap 11 5 - 15  CBC     Status: Abnormal   Collection Time: 08/30/15  1:49 PM  Result Value Ref Range   WBC 12.1 (H) 3.8 - 10.6 K/uL   RBC 4.65 4.40 - 5.90 MIL/uL   Hemoglobin 14.4 13.0 - 18.0 g/dL   HCT 42.0 40.0 - 52.0 %   MCV 90.3 80.0 - 100.0 fL   MCH 30.8 26.0 - 34.0 pg   MCHC 34.2 32.0 - 36.0 g/dL   RDW 14.5 11.5 - 14.5 %   Platelets 216 150 - 440 K/uL  Troponin I     Status: Abnormal   Collection Time: 08/30/15  1:49 PM  Result Value Ref Range   Troponin I 0.12 (HH) <0.03 ng/mL  APTT     Status: None   Collection Time: 08/30/15  1:49 PM  Result Value Ref Range   aPTT 30 24 - 36 seconds  Protime-INR     Status: None   Collection Time: 08/30/15  1:49 PM  Result Value Ref Range   Prothrombin Time 14.5 11.4 - 15.2 seconds   INR 1.12   Brain natriuretic peptide     Status: Abnormal   Collection Time: 08/30/15  1:53 PM  Result Value Ref Range   B Natriuretic Peptide 921.0 (H) 0.0 - 100.0 pg/mL  Heparin level (unfractionated)     Status: Abnormal   Collection Time: 08/30/15  3:00 PM  Result Value Ref Range   Heparin Unfractionated <0.10 (L) 0.30 - 0.70 IU/mL   ____________________________________________  EKG  Time: 13:49  Indication: weakness  Rate: 126  Rhythm: NSR Axis:  left Other: LAFB no acute ST elevations or depression ____________________________________________  RADIOLOGY  Chest x-ray with interval pulmonary edema and effusions. ____________________________________________   PROCEDURES  Procedure(s) performed: none    Critical Care performed: yes CRITICAL CARE Performed by: Merlyn Lot   Total critical care time: 20 minutes  Critical care time was exclusive of separately billable procedures and treating other patients.  Critical care was necessary to treat or prevent imminent or life-threatening deterioration.  Critical care was time spent personally by me on the following activities: development of treatment plan with patient and/or surrogate as well as nursing, discussions with consultants, evaluation of patient's response to treatment, examination of patient, obtaining history from patient or surrogate, ordering and performing treatments and interventions, ordering and review of laboratory studies, ordering and review of radiographic studies, pulse oximetry and re-evaluation of  patient's condition.   ____________________________________________   INITIAL IMPRESSION / ASSESSMENT AND PLAN / ED COURSE  Pertinent labs & imaging results that were available during my care of the patient were reviewed by me and considered in my medical decision making (see chart for details).  DDX:acs, pe, pna, heart failure, electrolyte abn  Orry Sigl is a 80 y.o. who presents to the ED with Right lower STEMI swelling in addition to several days of weakness and shortness of breath. Patient denies any active chest pain at this time. He is tachycardic but does not have any acute hypoxia. Based on his acute presentation and complaints and will order EKG, laboratory evaluation and placement on telemetry as well as continuous pulse oximetry.  Clinical Course  Comment By Time  Troponin elevation to 0.12 noted. Patient is tachycardic and based on his  right lower extremity swelling and concern for PE will order CT angiogram to evaluate for PE. We'll start patient on IV heparin drip. Merlyn Lot, MD 08/07 1427    ----------------------------------------- 4:30 PM on 08/30/2015 -----------------------------------------  I personally reviewed the CT imaging do not see any evidence of saddle embolus. Never diffuse and is ongoing. The patient is still persistently tachycardic to 120s. We'll give single dose of IV Lopressor for rate control. Presentation is mixed at this point likely secondary to decompensated heart failure worsening edema with troponin elevation concerning for ACS.  He does have an elevated white count but does not have fever or hypoxia at this time. The presentation was consistent with pneumonia or acute infectious process. I spoke with the hospitalist regarding admission for further evaluation and management..  Have discussed with the patient and available family all diagnostics and treatments performed thus far and all questions were answered to the best of my ability. The patient demonstrates understanding and agreement with plan.  ____________________________________________   FINAL CLINICAL IMPRESSION(S) / ED DIAGNOSES  Final diagnoses:  Acute on chronic diastolic congestive heart failure (HCC)  Dyspnea  ACS (abdominal compartment syndrome), initial encounter      NEW MEDICATIONS STARTED DURING THIS VISIT:  New Prescriptions   No medications on file     Note:  This document was prepared using Dragon voice recognition software and may include unintentional dictation errors.    Merlyn Lot, MD 08/30/15 [phone]

## 2015-08-31 ENCOUNTER — Inpatient Hospital Stay: Admit: 2015-08-31 | Payer: Medicare Other

## 2015-08-31 ENCOUNTER — Encounter
Admission: EM | Disposition: A | Discharge status: Other Acute Inpatient Hospital | Payer: Self-pay | Source: Home / Self Care | Attending: Internal Medicine

## 2015-08-31 ENCOUNTER — Inpatient Hospital Stay
Admit: 2015-08-31 | Discharge: 2015-08-31 | Disposition: A | Discharge status: Home / Self Care | Payer: Medicare Other | Attending: Cardiovascular Disease | Admitting: Cardiovascular Disease

## 2015-08-31 DIAGNOSIS — L8990 Pressure ulcer of unspecified site, unspecified stage: Secondary | ICD-10-CM | POA: Insufficient documentation

## 2015-08-31 LAB — BASIC METABOLIC PANEL
Anion gap: 13 (ref 5–15)
BUN: 32 mg/dL — AB (ref 6–20)
CO2: 23 mmol/L (ref 22–32)
CREATININE: 1.44 mg/dL — AB (ref 0.61–1.24)
Calcium: 9.6 mg/dL (ref 8.9–10.3)
Chloride: 102 mmol/L (ref 101–111)
GFR, EST AFRICAN AMERICAN: 52 mL/min — AB (ref >60)
GFR, EST NON AFRICAN AMERICAN: 45 mL/min — AB (ref >60)
Glucose, Bld: 285 mg/dL — ABNORMAL HIGH (ref 65–99)
Potassium: 4.2 mmol/L (ref 3.5–5.1)
SODIUM: 138 mmol/L (ref 135–145)

## 2015-08-31 LAB — CBC
HCT: 40.6 % (ref 40.0–52.0)
Hemoglobin: 14 g/dL (ref 13.0–18.0)
MCH: 31.1 pg (ref 26.0–34.0)
MCHC: 34.5 g/dL (ref 32.0–36.0)
MCV: 90.1 fL (ref 80.0–100.0)
PLATELETS: 217 10*3/uL (ref 150–440)
RBC: 4.51 MIL/uL (ref 4.40–5.90)
RDW: 14 % (ref 11.5–14.5)
WBC: 13.1 10*3/uL — ABNORMAL HIGH (ref 3.8–10.6)

## 2015-08-31 LAB — LIPID PANEL
Cholesterol: 227 mg/dL — ABNORMAL HIGH (ref 0–200)
HDL: 32 mg/dL — ABNORMAL LOW (ref >40)
LDL CALC: 147 mg/dL — AB (ref 0–99)
TRIGLYCERIDES: 241 mg/dL — AB (ref <150)
Total CHOL/HDL Ratio: 7.1 RATIO
VLDL: 48 mg/dL — AB (ref 0–40)

## 2015-08-31 LAB — GLUCOSE, CAPILLARY
GLUCOSE-CAPILLARY: 366 mg/dL — AB (ref 65–99)
GLUCOSE-CAPILLARY: 347 mg/dL — AB (ref 65–99)

## 2015-08-31 LAB — HEPARIN LEVEL (UNFRACTIONATED)
Heparin Unfractionated: 0.71 IU/mL — ABNORMAL HIGH (ref 0.30–0.70)
Heparin Unfractionated: <0.1 IU/mL — ABNORMAL LOW (ref 0.30–0.70)

## 2015-08-31 LAB — TROPONIN I: TROPONIN I: 0.13 ng/mL — AB (ref <0.03)

## 2015-08-31 SURGERY — CORONARY ANGIOGRAM
Anesthesia: Moderate Sedation

## 2015-08-31 MED ORDER — ASPIRIN 81 MG PO CHEW: 81.0000 mg | CHEWABLE_TABLET | ORAL | Status: DC

## 2015-08-31 MED ORDER — INSULIN GLARGINE 100 UNIT/ML ~~LOC~~ SOLN
12.0000 [IU] | Freq: Every day | SUBCUTANEOUS | Status: DC
  Administered 2015-08-31: 12 [IU] via SUBCUTANEOUS
  Filled 2015-08-31 (×2): qty 0.12

## 2015-08-31 MED ORDER — FENTANYL CITRATE (PF) 100 MCG/2ML IJ SOLN
INTRAMUSCULAR | Status: AC
  Filled 2015-08-31: qty 2

## 2015-08-31 MED ORDER — FUROSEMIDE 10 MG/ML IJ SOLN
40.0000 mg | Freq: Once | INTRAMUSCULAR | Status: AC
  Administered 2015-08-31: 40 mg via INTRAVENOUS

## 2015-08-31 MED ORDER — SODIUM CHLORIDE 0.9 % WEIGHT BASED INFUSION
1.0000 mL/kg/h | INTRAVENOUS | Status: DC
  Administered 2015-08-31: 1 mL/kg/h via INTRAVENOUS

## 2015-08-31 MED ORDER — FENTANYL CITRATE (PF) 100 MCG/2ML IJ SOLN
INTRAMUSCULAR | Status: DC | PRN
  Administered 2015-08-31: 25 ug via INTRAVENOUS

## 2015-08-31 MED ORDER — IOPAMIDOL (ISOVUE-300) INJECTION 61%
INTRAVENOUS | Status: DC | PRN
  Administered 2015-08-31: 90 mL via INTRA_ARTERIAL

## 2015-08-31 MED ORDER — MIDAZOLAM HCL 2 MG/2ML IJ SOLN
INTRAMUSCULAR | Status: DC | PRN
  Administered 2015-08-31: 1 mg via INTRAVENOUS

## 2015-08-31 MED ORDER — MIDAZOLAM HCL 2 MG/2ML IJ SOLN
INTRAMUSCULAR | Status: AC
  Filled 2015-08-31: qty 2

## 2015-08-31 MED ORDER — SODIUM CHLORIDE 0.9% FLUSH: 3.0000 mL | INTRAVENOUS | Status: DC | PRN

## 2015-08-31 MED ORDER — SODIUM CHLORIDE 0.9% FLUSH: 3.0000 mL | Freq: Two times a day (BID) | INTRAVENOUS | Status: DC

## 2015-08-31 MED ORDER — ATORVASTATIN CALCIUM 20 MG PO TABS
80.0000 mg | ORAL_TABLET | Freq: Every day | ORAL | Status: DC
  Administered 2015-09-01: 80 mg via ORAL
  Filled 2015-08-31: qty 4

## 2015-08-31 MED ORDER — HEPARIN (PORCINE) IN NACL 2-0.9 UNIT/ML-% IJ SOLN
INTRAMUSCULAR | Status: AC
  Filled 2015-08-31: qty 500

## 2015-08-31 MED ORDER — FUROSEMIDE 10 MG/ML IJ SOLN
INTRAMUSCULAR | Status: AC
  Filled 2015-08-31: qty 4

## 2015-08-31 MED ORDER — ASPIRIN 81 MG PO CHEW
81.0000 mg | CHEWABLE_TABLET | Freq: Once | ORAL | Status: AC
  Administered 2015-08-31: 81 mg via ORAL
  Filled 2015-08-31: qty 1

## 2015-08-31 MED ORDER — SODIUM CHLORIDE 0.9 % WEIGHT BASED INFUSION
3.0000 mL/kg/h | INTRAVENOUS | Status: DC
  Administered 2015-08-31: 3 mL/kg/h via INTRAVENOUS

## 2015-08-31 MED ORDER — SODIUM CHLORIDE 0.9 % IV SOLN: 250.0000 mL | INTRAVENOUS | Status: DC | PRN

## 2015-08-31 SURGICAL SUPPLY — 11 items
CATH INFINITI 5 FR 3DRC (CATHETERS) ×3 IMPLANT
CATH INFINITI 5FR ANG PIGTAIL (CATHETERS) IMPLANT
CATH INFINITI 5FR JL4 (CATHETERS) ×3 IMPLANT
CATH INFINITI JR4 5F (CATHETERS) ×3 IMPLANT
DEVICE CLOSURE MYNXGRIP 5F (Vascular Products) ×3 IMPLANT
KIT MANI 3VAL PERCEP (MISCELLANEOUS) ×4 IMPLANT
NDL PERC 18GX7CM (NEEDLE) IMPLANT
NEEDLE PERC 18GX7CM (NEEDLE) ×4 IMPLANT
PACK CARDIAC CATH (CUSTOM PROCEDURE TRAY) ×4 IMPLANT
SHEATH PINNACLE 5F 10CM (SHEATH) ×3 IMPLANT
WIRE EMERALD 3MM-J .035X150CM (WIRE) ×3 IMPLANT

## 2015-08-31 NOTE — Consult Note (Signed)
WOC Nurse wound consult note Reason for Consult: Stage 2 pressure ulcer to sacrum, right upper buttocks, present on admission.   Wound type:stage 2 pressure injury Pressure Ulcer POA: Yes Measurement: 1.5 cm x 1 cm x 0.2 cm  Wound OZH:YQMVbed:pink and moist Drainage (amount, consistency, odor) Minimal serosanguinous  No odor.  Periwound:Intact Dressing procedure/placement/frequency:  Sacral silicone border foam dressing. Change every 3 days and PRN soilage  Turn and reposition every two hours.  Will not follow at this time.  Please re-consult if needed.  Maple HudsonKaren Arna Luis RN BSN CWON Pager [phone]

## 2015-08-31 NOTE — Progress Notes (Signed)
Isaac Haynes is a 80 y.o. male  [id number]  Primary Cardiologist: Neoma Laming Reason for Consultation: CHF  HPI: This is a 80 year old white male with a past medical history of hypertension renal insufficiency kidney stones diabetes presented to the hospital with shortness of breath for the past 2 weeks. Patient states that he's been having swelling of the legs orthopnea PND but no chest pain.   Review of Systems: Patient denies chest pain.   Past Medical History:  Diagnosis Date  . Diabetes mellitus without complication ([place])   . Kidney stones     Medications Prior to Admission  Medication Sig Dispense Refill  . Blood Glucose Monitoring Suppl (ONE TOUCH ULTRA SYSTEM KIT) W/DEVICE KIT 1 kit by Does not apply route once. 1 each 0  . metFORMIN (GLUCOPHAGE) 1000 MG tablet Take 1 tablet (1,000 mg total) by mouth 2 (two) times daily with a meal. (Patient not taking: Reported on 08/30/2015) 60 tablet 3  . sitaGLIPtin (JANUVIA) 100 MG tablet Take 1 tablet (100 mg total) by mouth daily. (Patient not taking: Reported on 08/30/2015) 30 tablet 2     . atorvastatin  80 mg Oral q1800  . carvedilol  3.125 mg Oral BID WC  . furosemide  40 mg Intravenous BID  . insulin aspart  0-9 Units Subcutaneous TID WC  . sodium chloride flush  3 mL Intravenous Q12H    Infusions: . heparin 1,100 Units/hr (08/31/15 0544)    No Known Allergies  Social History   Social History  . Marital status: Married    Spouse name: N/A  . Number of children: N/A  . Years of education: N/A   Occupational History  . Not on file.   Social History Main Topics  . Smoking status: Never Smoker  . Smokeless tobacco: Never Used  . Alcohol use Yes     Comment: very little  . Drug use: No  . Sexual activity: Not on file   Other Topics Concern  . Not on file   Social History Narrative  . No narrative on file    Family History  Problem Relation Age of Onset  . Heart disease Mother   . Hypertension Mother    . Diabetes Mother   . Heart disease Father   . Hypertension Father     PHYSICAL EXAM: Vitals:   08/30/15 1953 08/31/15 0358  BP: 110/81 108/67  Pulse: (!) 123 (!) 122  Resp: 20 18  Temp: 98.2 F (36.8 C) 97.7 F (36.5 C)     Intake/Output Summary (Last 24 hours) at 08/31/15 0906 Last data filed at 08/31/15 0700  Gross per 24 hour  Intake            171.4 ml  Output              300 ml  Net           -128.6 ml    General:  Well appearing. No respiratory difficulty HEENT: normal Neck: supple. no JVD. Carotids 2+ bilat; no bruits. No lymphadenopathy or thryomegaly appreciated. Cor: PMI nondisplaced. Regular rate & rhythm. No rubs, gallops or murmurs. Lungs: clear Abdomen: soft, nontender, nondistended. No hepatosplenomegaly. No bruits or masses. Good bowel sounds. Extremities: no cyanosis, clubbing, rash, edema Neuro: alert & oriented x 3, cranial nerves grossly intact. moves all 4 extremities w/o difficulty. Affect pleasant.  ECG: Sinus tachycardia with poor R-wave progression suggestive of old anteroseptal wall MI  Results for orders placed or performed during  the hospital encounter of 08/30/15 (from the past 24 hour(s))  Basic metabolic panel     Status: Abnormal   Collection Time: 08/30/15  1:49 PM  Result Value Ref Range   Sodium 135 135 - 145 mmol/L   Potassium 4.4 3.5 - 5.1 mmol/L   Chloride 101 101 - 111 mmol/L   CO2 23 22 - 32 mmol/L   Glucose, Bld 387 (H) 65 - 99 mg/dL   BUN 26 (H) 6 - 20 mg/dL   Creatinine, Ser 1.39 (H) 0.61 - 1.24 mg/dL   Calcium 9.3 8.9 - 10.3 mg/dL   GFR calc non Af Amer 47 (L) >60 mL/min   GFR calc Af Amer 54 (L) >60 mL/min   Anion gap 11 5 - 15  CBC     Status: Abnormal   Collection Time: 08/30/15  1:49 PM  Result Value Ref Range   WBC 12.1 (H) 3.8 - 10.6 K/uL   RBC 4.65 4.40 - 5.90 MIL/uL   Hemoglobin 14.4 13.0 - 18.0 g/dL   HCT 42.0 40.0 - 52.0 %   MCV 90.3 80.0 - 100.0 fL   MCH 30.8 26.0 - 34.0 pg   MCHC 34.2 32.0 - 36.0  g/dL   RDW 14.5 11.5 - 14.5 %   Platelets 216 150 - 440 K/uL  Troponin I     Status: Abnormal   Collection Time: 08/30/15  1:49 PM  Result Value Ref Range   Troponin I 0.12 (HH) <0.03 ng/mL  APTT     Status: None   Collection Time: 08/30/15  1:49 PM  Result Value Ref Range   aPTT 30 24 - 36 seconds  Protime-INR     Status: None   Collection Time: 08/30/15  1:49 PM  Result Value Ref Range   Prothrombin Time 14.5 11.4 - 15.2 seconds   INR 1.12   Brain natriuretic peptide     Status: Abnormal   Collection Time: 08/30/15  1:53 PM  Result Value Ref Range   B Natriuretic Peptide 921.0 (H) 0.0 - 100.0 pg/mL  Heparin level (unfractionated)     Status: Abnormal   Collection Time: 08/30/15  3:00 PM  Result Value Ref Range   Heparin Unfractionated <0.10 (L) 0.30 - 0.70 IU/mL  TSH     Status: None   Collection Time: 08/30/15  7:57 PM  Result Value Ref Range   TSH 2.181 0.350 - 4.500 uIU/mL  Troponin I     Status: Abnormal   Collection Time: 08/30/15  7:57 PM  Result Value Ref Range   Troponin I 0.12 (HH) <0.03 ng/mL  Glucose, capillary     Status: Abnormal   Collection Time: 08/30/15  9:17 PM  Result Value Ref Range   Glucose-Capillary 531 (HH) 65 - 99 mg/dL  Urinalysis complete, with microscopic     Status: Abnormal   Collection Time: 08/30/15  9:24 PM  Result Value Ref Range   Color, Urine STRAW (A) YELLOW   APPearance CLEAR (A) CLEAR   Glucose, UA >500 (A) NEGATIVE mg/dL   Bilirubin Urine NEGATIVE NEGATIVE   Ketones, ur NEGATIVE NEGATIVE mg/dL   Specific Gravity, Urine 1.011 1.005 - 1.030   Hgb urine dipstick 1+ (A) NEGATIVE   pH 5.0 5.0 - 8.0   Protein, ur NEGATIVE NEGATIVE mg/dL   Nitrite NEGATIVE NEGATIVE   Leukocytes, UA NEGATIVE NEGATIVE   RBC / HPF NONE SEEN 0 - 5 RBC/hpf   WBC, UA NONE SEEN 0 - 5 WBC/hpf  Bacteria, UA NONE SEEN NONE SEEN   Squamous Epithelial / LPF NONE SEEN NONE SEEN   Mucous PRESENT   Troponin I     Status: Abnormal   Collection Time: 08/31/15   2:15 AM  Result Value Ref Range   Troponin I 0.13 (HH) <0.03 ng/mL  CBC     Status: Abnormal   Collection Time: 08/31/15  2:15 AM  Result Value Ref Range   WBC 13.1 (H) 3.8 - 10.6 K/uL   RBC 4.51 4.40 - 5.90 MIL/uL   Hemoglobin 14.0 13.0 - 18.0 g/dL   HCT 40.6 40.0 - 52.0 %   MCV 90.1 80.0 - 100.0 fL   MCH 31.1 26.0 - 34.0 pg   MCHC 34.5 32.0 - 36.0 g/dL   RDW 14.0 11.5 - 14.5 %   Platelets 217 150 - 440 K/uL  Basic metabolic panel     Status: Abnormal   Collection Time: 08/31/15  2:15 AM  Result Value Ref Range   Sodium 138 135 - 145 mmol/L   Potassium 4.2 3.5 - 5.1 mmol/L   Chloride 102 101 - 111 mmol/L   CO2 23 22 - 32 mmol/L   Glucose, Bld 285 (H) 65 - 99 mg/dL   BUN 32 (H) 6 - 20 mg/dL   Creatinine, Ser 1.44 (H) 0.61 - 1.24 mg/dL   Calcium 9.6 8.9 - 10.3 mg/dL   GFR calc non Af Amer 45 (L) >60 mL/min   GFR calc Af Amer 52 (L) >60 mL/min   Anion gap 13 5 - 15  Lipid panel     Status: Abnormal   Collection Time: 08/31/15  2:15 AM  Result Value Ref Range   Cholesterol 227 (H) 0 - 200 mg/dL   Triglycerides 241 (H) <150 mg/dL   HDL 32 (L) >40 mg/dL   Total CHOL/HDL Ratio 7.1 RATIO   VLDL 48 (H) 0 - 40 mg/dL   LDL Cholesterol 147 (H) 0 - 99 mg/dL  Heparin level (unfractionated)     Status: Abnormal   Collection Time: 08/31/15  2:15 AM  Result Value Ref Range   Heparin Unfractionated 0.71 (H) 0.30 - 0.70 IU/mL  Glucose, capillary     Status: Abnormal   Collection Time: 08/31/15  7:34 AM  Result Value Ref Range   Glucose-Capillary 366 (H) 65 - 99 mg/dL   Comment 1 Notify RN    Comment 2 Document in Chart    Dg Chest 2 View  Result Date: 08/30/2015 CLINICAL DATA:  Shortness of breath with legs and feet swelling for 2 weeks. EXAM: CHEST  2 VIEW COMPARISON:  09/17/2014. FINDINGS: The cardio pericardial silhouette is enlarged. There is pulmonary vascular congestion with interstitial pulmonary edema noted. No focal airspace consolidation. No substantial pleural effusion.  Bones are diffusely demineralized. IMPRESSION: Cardiomegaly with interstitial pulmonary edema. Electronically Signed   By: Misty Stanley M.D.   On: 08/30/2015 14:42   Ct Angio Chest Pe W And/or Wo Contrast  Result Date: 08/30/2015 CLINICAL DATA:  80 year old male with shortness of breath for 1.5 weeks following episode of a kidney stone. Bilateral lower extremity swelling. Tachycardia. Initial encounter. EXAM: CT ANGIOGRAPHY CHEST WITH CONTRAST TECHNIQUE: Multidetector CT imaging of the chest was performed using the standard protocol during bolus administration of intravenous contrast. Multiplanar CT image reconstructions and MIPs were obtained to evaluate the vascular anatomy. CONTRAST:  75 mL Isovue 370 COMPARISON:  Chest radiographs 1428 hours today and earlier. FINDINGS: Good contrast bolus timing in the pulmonary arterial  tree. No focal filling defect identified in the pulmonary arteries to suggest acute pulmonary embolism. Moderate left greater than right layering pleural effusions. Cardiomegaly with no pericardial effusion. Calcified coronary artery atherosclerosis. Reactive appearing mediastinal lymphadenopathy. There is calcified atherosclerosis of the visible abdominal aorta. Major airways are patent. There is bilateral pulmonary septal thickening. There is superimposed mild patchy perihilar peribronchial Negative visualized liver, spleen, adrenal glands, and bowel in the upper abdomen. Osteopenia. No acute osseous abnormality identified. Opacity greater on the right. Review of the MIP images confirms the above findings. IMPRESSION: 1.  No evidence of acute pulmonary embolus. 2. Moderate layering pleural effusions with pulmonary septal thickening and cardiomegaly. Favor Acute Pulmonary Edema. Mild right greater than left perihilar opacity also felt related to the edema, with main differential consideration of viral or atypical respiratory infection. 3. Calcified coronary artery and aortic  atherosclerosis. Electronically Signed   By: Genevie Ann M.D.   On: 08/30/2015 16:31     ASSESSMENT AND PLAN: Congestive heart failure with multiple risk factors for coronary artery disease. Agree with giving Lasix right now but we will locate echocardiogram and decide as to whether the patient needs modification for treatment of heart failure. May need Entresto or Ace inhibitors depending on ejection fraction.  Mathew Storck A

## 2015-08-31 NOTE — Progress Notes (Signed)
Dr. Welton FlakesKhan called.  Patient having rapid and slightly labored breathing.  Lasix 40 mg IV given per order.  Foley inserted.  Heparin to be restarted at 1930 tonight.

## 2015-08-31 NOTE — Consult Note (Signed)
ANTICOAGULATION CONSULT NOTE - Initial Consult  Pharmacy Consult for heparin drip Indication: chest pain/ACS  No Known Allergies  Patient Measurements: Height: 5\' 10"  (177.8 cm) Weight: 182 lb (82.6 kg) IBW/kg (Calculated) : 73 Heparin Dosing Weight: 93.8  Vital Signs: Temp: 98.2 F (36.8 C) (08/07 1953) Temp Source: Oral (08/07 1953) BP: 110/81 (08/07 1953) Pulse Rate: 123 (08/07 1953)  Labs:  Recent Labs  08/30/15 1349 08/30/15 1500 08/30/15 1957 08/31/15 0215  HGB 14.4  --   --  14.0  HCT 42.0  --   --  40.6  PLT 216  --   --  217  APTT 30  --   --   --   LABPROT 14.5  --   --   --   INR 1.12  --   --   --   HEPARINUNFRC  --  <0.10*  --  0.71*  CREATININE 1.39*  --   --   --   TROPONINI 0.12*  --  0.12*  --     Estimated Creatinine Clearance: 45.2 mL/min (by C-G formula based on SCr of 1.39 mg/dL).   Medical History: Past Medical History:  Diagnosis Date  . Diabetes mellitus without complication ([facility])   . Kidney stones     Medications:  Scheduled:  . carvedilol  3.125 mg Oral BID WC  . furosemide  40 mg Intravenous BID  . heparin      . insulin aspart  0-9 Units Subcutaneous TID WC  . sodium chloride flush  3 mL Intravenous Q12H    Assessment: Pt is a 80 year old male who presents with chest pain and SOB. Elevated troponin. Pharmacy consulted to dose a heparin drip.   Goal of Therapy:  Heparin level 0.3-0.7 units/ml Monitor platelets by anticoagulation protocol: Yes   Plan:  Give 4000 units bolus x 1 Start heparin infusion at 1200 units/hr Check anti-Xa level in 8 hours and daily while on heparin Continue to monitor H&H and platelets   8/8 02:00 heparin level 0.71. Reduce to 1100 units/hr and recheck in 8 hours.  Kaspar Albornoz S 08/31/2015,2:50 AM

## 2015-08-31 NOTE — Progress Notes (Signed)
Pt returned from cath lab, VSS, R groin site WNL, palpable pedal pulses. Foley in place, on RA. Will cont. To monitor.

## 2015-08-31 NOTE — Progress Notes (Signed)
NS administered at 247.8cc/hr via pump for one hour per MD order.

## 2015-08-31 NOTE — Progress Notes (Signed)
Patient is alert and oriented, no complaints of pain.  NPO for cardiac catheterization today.  Consent is signed and patient has been educated.  Heparin gtt at 7311ml/hr.  Groin sites have been prepped by nursing tech.

## 2015-08-31 NOTE — Progress Notes (Signed)
Patient has a high-grade lesion and mid RCA but the vessel is very tortuous and calcified. LAD and left circumflex also has moderate disease. LV gram was deferred the due to renal insufficiency. Ejection fraction will have to be done by echocardiogram. I reviewed the films with Dr. Marlana Latusuane Calderwood and it was decided that the films should be reviewed by [facility] before any consideration for intervention as it may be difficult to do here. Thus [facility] but right now it's close to 6 PM and nobody is picking up the phone over there. In the morning will assess due to review the films and will decide whether intervention should be done or not. In the meantime patient should be medically treated.

## 2015-08-31 NOTE — Progress Notes (Signed)
IVF of NS rate decreased to 82.6cc/hr via pump per MD order.

## 2015-08-31 NOTE — Progress Notes (Signed)
Patient restless.  O2 was placed at 2L/min via [place] at 1740.  Cardiopulmonary called to assess pt.  Up on bedside commode, per pt.  "I just need to sit up".  Pt. Calmed after getting up.  VS stabilized.  Report called to 2A nurse.

## 2015-08-31 NOTE — Care Management (Signed)
New onset CHF. Referral made to CHF clinic. Elevated troponin, Heparin gtt.  Lives at home with wife.

## 2015-08-31 NOTE — Progress Notes (Signed)
[facility] [place] [facility]   PATIENT NAME: Isaac Haynes    MRN#:  [id number]  DATE OF BIRTH:  1935/07/31  SUBJECTIVE:  Hospital Day: 1 day Isaac AnonWillis Haynes is a 80 y.o. male presenting with Weakness and Shortness of Breath .   Overnight events: No overnight events Interval Events: Still complains of dyspnea on exertion  REVIEW OF SYSTEMS:  CONSTITUTIONAL: No fever, fatigue or weakness.  EYES: No blurred or double vision.  EARS, NOSE, AND THROAT: No tinnitus or ear pain.  RESPIRATORY: No cough, Positive shortness of breath, denies wheezing or hemoptysis.  CARDIOVASCULAR: No chest pain, orthopnea, edema.  GASTROINTESTINAL: No nausea, vomiting, diarrhea or abdominal pain.  GENITOURINARY: No dysuria, hematuria.  ENDOCRINE: No polyuria, nocturia,  HEMATOLOGY: No anemia, easy bruising or bleeding SKIN: No rash or lesion. MUSCULOSKELETAL: No joint pain or arthritis.   NEUROLOGIC: No tingling, numbness, weakness.  PSYCHIATRY: No anxiety or depression.   DRUG ALLERGIES:  No Known Allergies  VITALS:  Blood pressure 101/65, pulse (!) 118, temperature 97.7 F (36.5 C), temperature source Oral, resp. rate 16, height 5\' 10"  (1.778 m), weight 182 lb (82.6 kg), SpO2 99 %.  PHYSICAL EXAMINATION:  VITAL SIGNS: Vitals:   08/31/15 0358 08/31/15 1130  BP: 108/67 101/65  Pulse: (!) 122 (!) 118  Resp: 18 16  Temp: 97.7 F (36.5 C) 97.7 F (36.5 C)   GENERAL:80 y.o.male currently in no acute distress.  HEAD: Normocephalic, atraumatic.  EYES: Pupils equal, round, reactive to light. Extraocular muscles intact. No scleral icterus.  MOUTH: Moist mucosal membrane. Dentition intact. No abscess noted.  EAR, NOSE, THROAT: Clear without exudates. No external lesions.  NECK: Supple. No thyromegaly. No nodules. No JVD.  PULMONARY: Scant basilar rhonchi without wheeze rails or rhonci. No use of accessory muscles, Good respiratory effort. good air entry  bilaterally CHEST: Nontender to palpation.  CARDIOVASCULAR: S1 and S2. Regular rate and rhythm. No murmurs, rubs, or gallops. 1+ edema. Pedal pulses 2+ bilaterally.  GASTROINTESTINAL: Soft, nontender, nondistended. No masses. Positive bowel sounds. No hepatosplenomegaly.  MUSCULOSKELETAL: No swelling, clubbing, or edema. Range of motion full in all extremities.  NEUROLOGIC: Cranial nerves II through XII are intact. No gross focal neurological deficits. Sensation intact. Reflexes intact.  SKIN: No ulceration, lesions, rashes, or cyanosis. Skin warm and dry. Turgor intact.  PSYCHIATRIC: Mood, affect within normal limits. The patient is awake, alert and oriented x 3. Insight, judgment intact.      LABORATORY PANEL:   CBC  Recent Labs Lab 08/31/15 0215  WBC 13.1*  HGB 14.0  HCT 40.6  PLT 217   ------------------------------------------------------------------------------------------------------------------  Chemistries   Recent Labs Lab 08/31/15 0215  NA 138  K 4.2  CL 102  CO2 23  GLUCOSE 285*  BUN 32*  CREATININE 1.44*  CALCIUM 9.6   ------------------------------------------------------------------------------------------------------------------  Cardiac Enzymes  Recent Labs Lab 08/31/15 0215  TROPONINI 0.13*   ------------------------------------------------------------------------------------------------------------------  RADIOLOGY:  Dg Chest 2 View  Result Date: 08/30/2015 CLINICAL DATA:  Shortness of breath with legs and feet swelling for 2 weeks. EXAM: CHEST  2 VIEW COMPARISON:  09/17/2014. FINDINGS: The cardio pericardial silhouette is enlarged. There is pulmonary vascular congestion with interstitial pulmonary edema noted. No focal airspace consolidation. No substantial pleural effusion. Bones are diffusely demineralized. IMPRESSION: Cardiomegaly with interstitial pulmonary edema. Electronically Signed   By: Kennith CenterEric  Mansell M.D.   On: 08/30/2015 14:42   Ct  Angio Chest Pe W And/or Wo Contrast  Result Date: 08/30/2015 CLINICAL  DATA:  80 year old male with shortness of breath for 1.5 weeks following episode of a kidney stone. Bilateral lower extremity swelling. Tachycardia. Initial encounter. EXAM: CT ANGIOGRAPHY CHEST WITH CONTRAST TECHNIQUE: Multidetector CT imaging of the chest was performed using the standard protocol during bolus administration of intravenous contrast. Multiplanar CT image reconstructions and MIPs were obtained to evaluate the vascular anatomy. CONTRAST:  75 mL Isovue 370 COMPARISON:  Chest radiographs 1428 hours today and earlier. FINDINGS: Good contrast bolus timing in the pulmonary arterial tree. No focal filling defect identified in the pulmonary arteries to suggest acute pulmonary embolism. Moderate left greater than right layering pleural effusions. Cardiomegaly with no pericardial effusion. Calcified coronary artery atherosclerosis. Reactive appearing mediastinal lymphadenopathy. There is calcified atherosclerosis of the visible abdominal aorta. Major airways are patent. There is bilateral pulmonary septal thickening. There is superimposed mild patchy perihilar peribronchial Negative visualized liver, spleen, adrenal glands, and bowel in the upper abdomen. Osteopenia. No acute osseous abnormality identified. Opacity greater on the right. Review of the MIP images confirms the above findings. IMPRESSION: 1.  No evidence of acute pulmonary embolus. 2. Moderate layering pleural effusions with pulmonary septal thickening and cardiomegaly. Favor Acute Pulmonary Edema. Mild right greater than left perihilar opacity also felt related to the edema, with main differential consideration of viral or atypical respiratory infection. 3. Calcified coronary artery and aortic atherosclerosis. Electronically Signed   By: Odessa Fleming M.D.   On: 08/30/2015 16:31    EKG:   Orders placed or performed during the hospital encounter of 08/30/15  . ED EKG  . ED EKG   . EKG 12-Lead  . EKG 12-Lead    ASSESSMENT AND PLAN:   Isaac Haynes is a 80 y.o. male presenting with Weakness and Shortness of Breath . Admitted 08/30/2015 : Day #: 1 day 1. NSTEMI: Cardiology input appreciated currently on heparin plans for cardiac catheterization later today 2. Acute congestive heart failure unspecified ejection fraction: Continue with diuresis with improvement in symptoms 3. Sacral decubitus ulcer present on admission: Wound consult appreciated 4. Type 2 diabetes non-insulin-requiring hold oral agents add sliding scale coverage, will start basal insulin, check hemoglobin A1c  All the records are reviewed and case discussed with Care Management/Social Workerr. Management plans discussed with the patient, family and they are in agreement.  CODE STATUS: full TOTAL TIME TAKING CARE OF THIS PATIENT: 33 minutes.   POSSIBLE D/C IN 2-3DAYS, DEPENDING ON CLINICAL CONDITION.   Christana Angelica,  Mardi Mainland.D on 08/31/2015 at 1:56 PM  Between 7am to 6pm - Pager - [phone]  After 6pm: House Pager: - [phone]  [facility]  Office  [phone]  CC: Primary care physician; No PCP Per Patient

## 2015-09-01 LAB — ECHOCARDIOGRAM COMPLETE
HEIGHTINCHES: 70 in
Weight: 2912 oz

## 2015-09-01 LAB — CBC
HCT: 40.6 % (ref 40.0–52.0)
Hemoglobin: 13.6 g/dL (ref 13.0–18.0)
MCH: 30.4 pg (ref 26.0–34.0)
MCHC: 33.4 g/dL (ref 32.0–36.0)
MCV: 91.2 fL (ref 80.0–100.0)
PLATELETS: 194 10*3/uL (ref 150–440)
RBC: 4.45 MIL/uL (ref 4.40–5.90)
RDW: 13.9 % (ref 11.5–14.5)
WBC: 13.3 10*3/uL — AB (ref 3.8–10.6)

## 2015-09-01 LAB — GLUCOSE, CAPILLARY
GLUCOSE-CAPILLARY: 254 mg/dL — AB (ref 65–99)
GLUCOSE-CAPILLARY: 227 mg/dL — AB (ref 65–99)
GLUCOSE-CAPILLARY: 251 mg/dL — AB (ref 65–99)
Glucose-Capillary: 274 mg/dL — ABNORMAL HIGH (ref 65–99)

## 2015-09-01 LAB — HEPARIN LEVEL (UNFRACTIONATED)
HEPARIN UNFRACTIONATED: 0.37 [IU]/mL (ref 0.30–0.70)
HEPARIN UNFRACTIONATED: 0.46 [IU]/mL (ref 0.30–0.70)

## 2015-09-01 LAB — HEMOGLOBIN A1C: HEMOGLOBIN A1C: 10.4 % — AB (ref 4.0–6.0)

## 2015-09-01 MED ORDER — MORPHINE SULFATE (PF) 2 MG/ML IV SOLN
2.0000 mg | INTRAVENOUS | Status: DC | PRN
Start: 2015-09-01 — End: 2015-09-01

## 2015-09-01 MED ORDER — OXYCODONE HCL 5 MG PO TABS: 5.0000 mg | ORAL_TABLET | ORAL | Status: DC | PRN

## 2015-09-01 MED ORDER — CYCLOBENZAPRINE HCL 10 MG PO TABS
5.0000 mg | ORAL_TABLET | Freq: Three times a day (TID) | ORAL | Status: DC | PRN
  Administered 2015-09-01: 5 mg via ORAL
  Filled 2015-09-01: qty 1

## 2015-09-01 NOTE — Consult Note (Signed)
ANTICOAGULATION CONSULT NOTE - Initial Consult  Pharmacy Consult for heparin drip Indication: chest pain/ACS  No Known Allergies  Patient Measurements: Height: 5\' 10"  (177.8 cm) Weight: 182 lb (82.6 kg) IBW/kg (Calculated) : 73 Heparin Dosing Weight: 93.8  Vital Signs: Temp: 97.8 F (36.6 C) (08/09 1048) Temp Source: Oral (08/09 1048) BP: 107/70 (08/09 1048) Pulse Rate: 117 (08/09 1048)  Labs:  Recent Labs  08/30/15 1349  08/30/15 1957 08/31/15 0215 08/31/15 1912 09/01/15 0326 09/01/15 0327 09/01/15 1132  HGB 14.4  --   --  14.0  --   --  13.6  --   HCT 42.0  --   --  40.6  --   --  40.6  --   PLT 216  --   --  217  --   --  194  --   APTT 30  --   --   --   --   --   --   --   LABPROT 14.5  --   --   --   --   --   --   --   INR 1.12  --   --   --   --   --   --   --   HEPARINUNFRC  --   < >  --  0.71* <0.10* 0.37  --  0.46  CREATININE 1.39*  --   --  1.44*  --   --   --   --   TROPONINI 0.12*  --  0.12* 0.13*  --   --   --   --   < > = values in this interval not displayed.  Estimated Creatinine Clearance: 43.7 mL/min (by C-G formula based on SCr of 1.44 mg/dL).   Medical History: Past Medical History:  Diagnosis Date  . Diabetes mellitus without complication (HCC)   . Kidney stones     Medications:  Scheduled:  . atorvastatin  80 mg Oral q1800  . carvedilol  3.125 mg Oral BID WC  . furosemide  40 mg Intravenous BID  . insulin aspart  0-9 Units Subcutaneous TID WC  . insulin glargine  12 Units Subcutaneous QHS    Assessment: Pt is a 80 year old male who presents with chest pain and SOB. Elevated troponin. Pharmacy consulted to dose a heparin drip.   Goal of Therapy:  Heparin level 0.3-0.7 units/ml Monitor platelets by anticoagulation protocol: Yes   Plan:  Current orders for heparin 1100 units/hr. Heparin level therapeutic x 2, will continue current rate. Recheck HL/CBC tomorrow AM if patient still here. [facility].  Dekota Kirlin C 09/01/2015,3:21 PM

## 2015-09-01 NOTE — Progress Notes (Signed)
Initial Heart Failure Clinic appointment scheduled on September 15, 2015 at 11:30am. Thank you for the referral.

## 2015-09-01 NOTE — Care Management (Signed)
New PCP appointment scheduled with [facility] for Friday August 18 @ 1:15 PM

## 2015-09-01 NOTE — Consult Note (Signed)
ANTICOAGULATION CONSULT NOTE - Initial Consult  Pharmacy Consult for heparin drip Indication: chest pain/ACS  No Known Allergies  Patient Measurements: Height: 5\' 10"  (177.8 cm) Weight: 182 lb (82.6 kg) IBW/kg (Calculated) : 73 Heparin Dosing Weight: 93.8  Vital Signs: Temp: 98.3 F (36.8 C) (08/09 0618) Temp Source: Oral (08/09 0618) BP: 112/75 (08/09 0618) Pulse Rate: 107 (08/09 0618)  Labs:  Recent Labs  08/30/15 1349  08/30/15 1957 08/31/15 0215 08/31/15 1912 09/01/15 0326  HGB 14.4  --   --  14.0  --   --   HCT 42.0  --   --  40.6  --   --   PLT 216  --   --  217  --   --   APTT 30  --   --   --   --   --   LABPROT 14.5  --   --   --   --   --   INR 1.12  --   --   --   --   --   HEPARINUNFRC  --   < >  --  0.71* <0.10* 0.37  CREATININE 1.39*  --   --  1.44*  --   --   TROPONINI 0.12*  --  0.12* 0.13*  --   --   < > = values in this interval not displayed.  Estimated Creatinine Clearance: 43.7 mL/min (by C-G formula based on SCr of 1.44 mg/dL).   Medical History: Past Medical History:  Diagnosis Date  . Diabetes mellitus without complication (HCC)   . Kidney stones     Medications:  Scheduled:  . atorvastatin  80 mg Oral q1800  . carvedilol  3.125 mg Oral BID WC  . furosemide  40 mg Intravenous BID  . insulin aspart  0-9 Units Subcutaneous TID WC  . insulin glargine  12 Units Subcutaneous QHS    Assessment: Pt is a 80 year old male who presents with chest pain and SOB. Elevated troponin. Pharmacy consulted to dose a heparin drip.   Goal of Therapy:  Heparin level 0.3-0.7 units/ml Monitor platelets by anticoagulation protocol: Yes   Plan:  Current orders for heparin 1100 units/hr. Previously stopped for cath, resumed at 1930 yesterday. First heparin level at 0330 therapeutic, will recheck in 8 hours.  Asyria Kolander C 09/01/2015,7:32 AM

## 2015-09-01 NOTE — Progress Notes (Signed)
[facility] transport on the floor to transport patient. Patient to be transported on RA, iv intake. Heparin infusing. No c/o chest pain. [facility]

## 2015-09-01 NOTE — Progress Notes (Signed)
[facility] status gone over with nurse. Called [facility] to transfer patient, however was told to call [facility] for transport. Patient to be transported on RA, IV'S will remain intact. Patient on heparin drip

## 2015-09-01 NOTE — Discharge Instructions (Signed)
Heart Failure Clinic appointment on September 15, 2015 at 11:30am with Isaac Kindredina Jaelyn Bourgoin, FNP. Please call [phone] to reschedule.

## 2015-09-01 NOTE — Progress Notes (Signed)
I spoke to Dr. Okey Duprerawford. [facility] Cath Lab [facility] where the phone number is [phone]. Patient will be transferred for angioplasty of the proximal and mid right coronary since his complicated PCI. There is tortuosity of the right coronary and should be done at Center where there is CABG program.

## 2015-09-01 NOTE — Evaluation (Signed)
Physical Therapy Evaluation Patient Details Name: Isaac Haynes MRN: [id number] DOB: [birth year] Today's Date: 09/01/2015   History of Present Illness  Isaac Haynes  is a 80 y.o. male with a known history of Diabetes type 2 who has not been taking any medications. He presents to the ED with complaint of shortness of breath, lower extremity swelling and chest pain. Patient reports that he has been having shortness of breath, progressively worse over one week. His noticed swelling of his lower extremities. He's also had nocturnal dyspnea or orthopnea. Patient also has intermittently been having left-sided sharp chest pains.  He has not had any fevers, chills, no nausea, vomiting or diarrhea. No radiation of the pain to his arms. Pt denies falls in the last 12 months  Clinical Impression  Pt demonstrates fair strength and stability with bed mobility, transfers, and limited ambulation. He does move slow and demonstrates some general deconditioning. General imbalance with history of falls but corrected with walker and home and during evaluation. Full balance screening deferred as pt imminently needs to use the [facility]. He is able to complete all bed exercises without change in vitals however with limited ambulation HR increases from 113 at rest to 122. Pt remains asymptomatic and denies chest pain or DOE. SaO2 remains >90% on room air throughout. Pt would benefit from [facility] PT for strengthening, balance, safety, and general conditioning. Pt will benefit from skilled PT services to address deficits in strength, balance, and mobility in order to return to full function at home.     Follow Up Recommendations Home health PT    Equipment Recommendations  None recommended by PT    Recommendations for Other Services       Precautions / Restrictions Precautions Precautions: Fall Restrictions Weight Bearing Restrictions: No      Mobility  Bed Mobility Overal bed mobility: Modified Independent              General bed mobility comments: Pt takes extended time, HOB elevated, and bed rails. however able to perform without external assistance. Pt sleeps in recliner at home  Transfers Overall transfer level: Needs assistance Equipment used: Rolling walker (2 wheeled) Transfers: Sit to/from Stand Sit to Stand: Min guard         General transfer comment: Pt with decreased LE power requiring increased time to come to standing. Steady with UE support. Significant forward trunk lean noted in standing  Ambulation/Gait Ambulation/Gait assistance: Min guard Ambulation Distance (Feet): 5 Feet Assistive device: Rolling walker (2 wheeled) Gait Pattern/deviations: Decreased step length - right;Decreased step length - left;Shuffle   Gait velocity interpretation: <1.8 ft/sec, indicative of risk for recurrent falls General Gait Details: Pt takes short shuffling steps from bed to recliner. He maintains forward leaning posture. No evidence for gross instability. Pt able to safely perform turns to transfer to recliner. HR 113 at rest and increases to 122 with very slow ambulation from bed to recliner. Further ambulation deferred at this time  Stairs            Wheelchair Mobility    Modified Rankin (Stroke Patients Only)       Balance Overall balance assessment: Needs assistance;History of Falls Sitting-balance support: No upper extremity supported Sitting balance-Leahy Scale: Good     Standing balance support: No upper extremity supported Standing balance-Leahy Scale: Fair Standing balance comment: Full balance screening deferred but pt reasonably safe with rolling walker. History of falls  Pertinent Vitals/Pain Pain Assessment: No/denies pain (Denies chest pain)    Home Living Family/patient expects to be discharged to:: Private residence Living Arrangements: Alone Available Help at Discharge: Family Type of Home: House Home Access: Stairs to  enter Entrance Stairs-Rails: None Entrance Stairs-Number of Steps: 3 Home Layout: One level Home Equipment: Cane - single point;Shower seat;Walker - 4 wheels;Walker - 2 wheels;Wheelchair - manual      Prior Function Level of Independence: Independent with assistive device(s)         Comments: Ambulates with rollator. Independent with ADLs/IADLs. Does not drives. Mostly stays at home     Hand Dominance   Dominant Hand: Right    Extremity/Trunk Assessment   Upper Extremity Assessment: Overall WFL for tasks assessed           Lower Extremity Assessment: Overall WFL for tasks assessed         Communication   Communication: No difficulties  Cognition Arousal/Alertness: Awake/alert Behavior During Therapy: WFL for tasks assessed/performed Overall Cognitive Status: Within Functional Limits for tasks assessed                      General Comments      Exercises General Exercises - Lower Extremity Ankle Circles/Pumps: Strengthening;Both;10 reps;Supine Quad Sets: Strengthening;Both;10 reps;Supine Gluteal Sets: Strengthening;Both;10 reps;Supine Heel Slides: Strengthening;Both;10 reps;Supine Hip ABduction/ADduction: Strengthening;Both;10 reps;Supine Straight Leg Raises: Strengthening;Both;10 reps;Supine      Assessment/Plan    PT Assessment Patient needs continued PT services  PT Diagnosis Generalized weakness;Difficulty walking;Abnormality of gait   PT Problem List Decreased mobility;Decreased strength;Decreased balance;Decreased activity tolerance;Cardiopulmonary status limiting activity  PT Treatment Interventions DME instruction;Gait training;Stair training;Therapeutic activities;Therapeutic exercise;Balance training;Neuromuscular re-education;Patient/family education   PT Goals (Current goals can be found in the Care Plan section) Acute Rehab PT Goals Patient Stated Goal: Return to prior level of function PT Goal Formulation: With patient Time For  Goal Achievement: 09/15/15 Potential to Achieve Goals: Good    Frequency Min 2X/week   Barriers to discharge Decreased caregiver support Lives alone    Co-evaluation               End of Session Equipment Utilized During Treatment: Gait belt Activity Tolerance: Treatment limited secondary to medical complications (Comment);Other (comment) (Tachycardic at rest and with low level activity) Patient left: with nursing/sitter in room;Other (comment) (Sitting on bedside commode with CNA. Chair alarm set up) Nurse Communication: Mobility status         Time: 1000-1022 PT Time Calculation (min) (ACUTE ONLY): 22 min   Charges:   PT Evaluation $PT Eval Moderate Complexity: 1 Procedure     PT G Codes:       Sharalyn InkJason D Harmonee Tozer PT, DPT   Aleecia Tapia 09/01/2015, 10:52 AM

## 2015-09-01 NOTE — Progress Notes (Signed)
Inpatient Diabetes Program Recommendations  AACE/ADA: New Consensus Statement on Inpatient Glycemic Control (2015)  Target Ranges:  Prepandial:   less than 140 mg/dL      Peak postprandial:   less than 180 mg/dL (1-2 hours)      Critically ill patients:  140 - 180 mg/dL   Results for Fletcher AnonMOORE, Bj (MRN [id number]) as of 09/01/2015 10:57  Ref. Range 08/30/2015 21:17 08/31/2015 07:34 08/31/2015 11:31 08/31/2015 21:44 09/01/2015 07:47  Glucose-Capillary Latest Ref Range: 65 - 99 mg/dL 782531 (HH) 956366 (H) 213347 (H) 254 (H) 227 (H)   Review of Glycemic Control  Diabetes history: DM2 Outpatient Diabetes medications: Metformin 1000 mg BID, Januvia 100 mg daily (according to home medication list patient is NOT taking any DM medications) Current orders for Inpatient glycemic control: Lantus 12 units QHS, Novolog 0-9 units TID with meals  Inpatient Diabetes Program Recommendations: Insulin - Basal: Fasting glucose 227 mg/dl this morning. Please consider increasing Lantus to 15 units QHS. Correction (SSI): Please consider ordering Novolog bedtime correction scale and increasing from Sensitive to Moderate correction scale TID with meals. HgbA1C: A1C in process.  Thanks, Orlando PennerMarie Kavari Parrillo, RN, MSN, CDE Diabetes Coordinator Inpatient Diabetes Program [phone] (Team Pager from 8am to 5pm) [phone] ([facility] office) [phone] [facility] office) [phone] [facility] office)

## 2015-09-01 NOTE — Care Management (Signed)
Patient does not have a PCP for home health PT. RNCM has made appointment with PCP for Aug . 18

## 2015-09-01 NOTE — Progress Notes (Signed)
  SUBJECTIVE: Patient is alert and oriented, denies chest pain, but does have orthopnea and shortness of breath on exertion.   Vitals:   08/31/15 1900 08/31/15 2000 09/01/15 0618 09/01/15 0753  BP: 103/63 111/78 112/75 107/74  Pulse: (!) 115 (!) 123 (!) 107 (!) 104  Resp: 19  20   Temp:  97.8 F (36.6 C) 98.3 F (36.8 C)   TempSrc:  Oral Oral   SpO2: 98% 96% 95%   Weight:      Height:        Intake/Output Summary (Last 24 hours) at 09/01/15 0951 Last data filed at 09/01/15 0700  Gross per 24 hour  Intake               99 ml  Output             1075 ml  Net             -976 ml    LABS: Basic Metabolic Panel:  Recent Labs  40/98/1106/08/09 1349 08/31/15 0215  NA 135 138  K 4.4 4.2  CL 101 102  CO2 23 23  GLUCOSE 387* 285*  BUN 26* 32*  CREATININE 1.39* 1.44*  CALCIUM 9.3 9.6   Liver Function Tests: No results for input(s): AST, ALT, ALKPHOS, BILITOT, PROT, ALBUMIN in the last 72 hours. No results for input(s): LIPASE, AMYLASE in the last 72 hours. CBC:  Recent Labs  08/31/15 0215 09/01/15 0327  WBC 13.1* 13.3*  HGB 14.0 13.6  HCT 40.6 40.6  MCV 90.1 91.2  PLT 217 194   Cardiac Enzymes:  Recent Labs  08/30/15 1349 08/30/15 1957 08/31/15 0215  TROPONINI 0.12* 0.12* 0.13*   BNP: Invalid input(s): POCBNP D-Dimer: No results for input(s): DDIMER in the last 72 hours. Hemoglobin A1C: No results for input(s): HGBA1C in the last 72 hours. Fasting Lipid Panel:  Recent Labs  08/31/15 0215  CHOL 227*  HDL 32*  LDLCALC 147*  TRIG 241*  CHOLHDL 7.1   Thyroid Function Tests:  Recent Labs  08/30/15 1957  TSH 2.181   Anemia Panel: No results for input(s): VITAMINB12, FOLATE, FERRITIN, TIBC, IRON, RETICCTPCT in the last 72 hours.   PHYSICAL EXAM General: Well developed, well nourished, in no acute distress HEENT:  Normocephalic and atramatic Neck:  No JVD.  Lungs: Clear bilaterally to auscultation and percussion. Heart: HRRR . Normal S1 and S2  without gallops or murmurs.  Abdomen: Bowel sounds are positive, abdomen soft and non-tender  Msk:  Back normal, normal gait. Normal strength and tone for age. Extremities: No clubbing, cyanosis or edema.   Neuro: Alert and oriented X 3. Psych:  Good affect, responds appropriately  TELEMETRY: Sinus tachycardia in the 100s  ASSESSMENT AND PLAN: Congestive heart failure multiple risk factors for coronary artery disease. Cardiac catheterization was done showing high-grade lesion and mid RCA but the vessel is very tortuous and calcified. LAD and left circumflex also has moderate disease. LV gram was deferred the due to renal insufficiency. [facility] and discussion of possible intervention.  Awaiting echocardiogram results. Continuing with diuresis.  Active Problems:   Acute CHF (congestive heart failure) (HCC)   Pressure ulcer    Berton BonJanine Ashyia Schraeder, NP 09/01/2015 9:51 AM

## 2015-09-01 NOTE — Care Management (Signed)
Spoke with patient at bedside. He states he lives at home alone but his wife and son assist with transportation to appointments. Uses a walker and a Rolator. [facility] No PCP. Wife ask that I set up a new patient appointment for him.

## 2015-09-01 NOTE — Discharge Summary (Signed)
[place] [facility] [place] NAME: Isaac Haynes    MR#:  [id number]  DATE OF BIRTH:  1935-08-01  DATE OF ADMISSION:  08/30/2015 ADMITTING PHYSICIAN: Dustin Flock, MD  DATE OF DISCHARGE: 09/01/15  PRIMARY CARE PHYSICIAN: No PCP Per Patient    ADMISSION DIAGNOSIS:  Dyspnea [R06.00] Acute on chronic diastolic congestive heart failure ([place]) [I50.33]   DISCHARGE DIAGNOSIS:  Active Problems:   Acute CHF (congestive heart failure) (HCC) unspecified EF   Pressure ulcer NSTEMI with RCA lesion   SECONDARY DIAGNOSIS:   Past Medical History:  Diagnosis Date  . Diabetes mellitus without complication ([place])   . Kidney stones     HOSPITAL COURSE:  Isaac Haynes  is a 80 y.o. male admitted 08/30/2015 with chief complaint Weakness and Shortness of Breath . Please see H&P performed by Dustin Flock, MD for further information. Patient presented with the above symptoms, noted to have elevated troponin. Started on heparin drip, cardiology consulted and performed cath - findings below. Given difficult procedure case discussed for transfer to [facility].   Cardiac catheterization was done showing high-grade lesion and mid RCA but the vessel is very tortuous and calcified. LAD and left circumflex also has moderate disease  DISCHARGE CONDITIONS:   stabilized   CONSULTS OBTAINED:  Treatment Team:  Dionisio David, MD  DRUG ALLERGIES:  No Known Allergies  DISCHARGE MEDICATIONS:   Current Discharge Medication List    CONTINUE these medications which have NOT CHANGED   Details  Blood Glucose Monitoring Suppl (ONE TOUCH ULTRA SYSTEM KIT) W/DEVICE KIT 1 kit by Does not apply route once. Qty: 1 each, Refills: 0    metFORMIN (GLUCOPHAGE) 1000 MG tablet Take 1 tablet (1,000 mg total) by mouth 2 (two) times daily with a meal. Qty: 60 tablet, Refills: 3    sitaGLIPtin (JANUVIA) 100 MG tablet Take 1 tablet (100 mg total) by mouth daily. Qty: 30 tablet, Refills: 2          DISCHARGE INSTRUCTIONS:    DIET:  Cardiac diet  DISCHARGE CONDITION:  Stable  ACTIVITY:  Activity as tolerated  OXYGEN:  Home Oxygen: No.   Oxygen Delivery: room air  DISCHARGE LOCATION:  [facility]  If you experience worsening of your admission symptoms, develop shortness of breath, life threatening emergency, suicidal or homicidal thoughts you must seek medical attention immediately by calling 911 or calling your MD immediately  if symptoms less severe.  You Must read complete instructions/literature along with all the possible adverse reactions/side effects for all the Medicines you take and that have been prescribed to you. Take any new Medicines after you have completely understood and accpet all the possible adverse reactions/side effects.   Please note  You were cared for by a hospitalist during your hospital stay. If you have any questions about your discharge medications or the care you received while you were in the hospital after you are discharged, you can call the unit and asked to speak with the hospitalist on call if the hospitalist that took care of you is not available. Once you are discharged, your primary care physician will handle any further medical issues. Please note that NO REFILLS for any discharge medications will be authorized once you are discharged, as it is imperative that you return to your primary care physician (or establish a relationship with a primary care physician if you do not have one) for your aftercare needs so that they can reassess your need for medications  and monitor your lab values.    On the day of Discharge:   VITAL SIGNS:  Blood pressure 107/70, pulse (!) 117, temperature 97.8 F (36.6 C), temperature source Oral, resp. rate 20, height _0  (1.778 m), weight 82.6 kg (182 lb), SpO2 95 %.  I/O:   Intake/Output Summary (Last 24 hours) at 09/01/15 1139 Last data filed at 09/01/15 1054  Gross per 24 hour  Intake                99 ml  Output             1675 ml  Net            -1576 ml    PHYSICAL EXAMINATION:  GENERAL:  80 y.o.-year-old patient lying in the bed with no acute distress.  EYES: Pupils equal, round, reactive to light and accommodation. No scleral icterus. Extraocular muscles intact.  HEENT: Head atraumatic, normocephalic. Oropharynx and nasopharynx clear.  NECK:  Supple, no jugular venous distention. No thyroid enlargement, no tenderness.  LUNGS: Normal breath sounds bilaterally, no wheezing, rales,rhonchi or crepitation. No use of accessory muscles of respiration.  CARDIOVASCULAR: S1, S2 normal. No murmurs, rubs, or gallops.  ABDOMEN: Soft, non-tender, non-distended. Bowel sounds present. No organomegaly or mass.  EXTREMITIES: 2+pedal edema, cyanosis, or clubbing.  NEUROLOGIC: Cranial nerves II through XII are intact. Muscle strength 5/5 in all extremities. Sensation intact. Gait not checked.  PSYCHIATRIC: The patient is alert and oriented x 3.  SKIN: No obvious rash, lesion, or ulcer.   DATA REVIEW:   CBC  Recent Labs Lab 09/01/15 0327  WBC 13.3*  HGB 13.6  HCT 40.6  PLT 194    Chemistries   Recent Labs Lab 08/31/15 0215  NA 138  K 4.2  CL 102  CO2 23  GLUCOSE 285*  BUN 32*  CREATININE 1.44*  CALCIUM 9.6    Cardiac Enzymes  Recent Labs Lab 08/31/15 0215  TROPONINI 0.13*    Microbiology Results  Results for orders placed or performed during the hospital encounter of 09/17/14  Surgical pcr screen     Status: None   Collection Time: 09/18/14  5:04 AM  Result Value Ref Range Status   MRSA, PCR NEGATIVE NEGATIVE Final   Staphylococcus aureus NEGATIVE NEGATIVE Final    Comment:        The Xpert SA Assay (FDA approved for NASAL specimens in patients over 41 years of age), is one component of a comprehensive surveillance program.  Test performance has been validated by [facility] for patients greater than or equal to 75 year old. It is not intended to  diagnose infection nor to guide or monitor treatment.     RADIOLOGY:  Dg Chest 2 View  Result Date: 08/30/2015 CLINICAL DATA:  Shortness of breath with legs and feet swelling for 2 weeks. EXAM: CHEST  2 VIEW COMPARISON:  09/17/2014. FINDINGS: The cardio pericardial silhouette is enlarged. There is pulmonary vascular congestion with interstitial pulmonary edema noted. No focal airspace consolidation. No substantial pleural effusion. Bones are diffusely demineralized. IMPRESSION: Cardiomegaly with interstitial pulmonary edema. Electronically Signed   By: Misty Stanley M.D.   On: 08/30/2015 14:42   Ct Angio Chest Pe W And/or Wo Contrast  Result Date: 08/30/2015 CLINICAL DATA:  80 year old male with shortness of breath for 1.5 weeks following episode of a kidney stone. Bilateral lower extremity swelling. Tachycardia. Initial encounter. EXAM: CT ANGIOGRAPHY CHEST WITH CONTRAST TECHNIQUE: Multidetector CT imaging of the chest was performed  using the standard protocol during bolus administration of intravenous contrast. Multiplanar CT image reconstructions and MIPs were obtained to evaluate the vascular anatomy. CONTRAST:  75 mL Isovue 370 COMPARISON:  Chest radiographs 1428 hours today and earlier. FINDINGS: Good contrast bolus timing in the pulmonary arterial tree. No focal filling defect identified in the pulmonary arteries to suggest acute pulmonary embolism. Moderate left greater than right layering pleural effusions. Cardiomegaly with no pericardial effusion. Calcified coronary artery atherosclerosis. Reactive appearing mediastinal lymphadenopathy. There is calcified atherosclerosis of the visible abdominal aorta. Major airways are patent. There is bilateral pulmonary septal thickening. There is superimposed mild patchy perihilar peribronchial Negative visualized liver, spleen, adrenal glands, and bowel in the upper abdomen. Osteopenia. No acute osseous abnormality identified. Opacity greater on the right.  Review of the MIP images confirms the above findings. IMPRESSION: 1.  No evidence of acute pulmonary embolus. 2. Moderate layering pleural effusions with pulmonary septal thickening and cardiomegaly. Favor Acute Pulmonary Edema. Mild right greater than left perihilar opacity also felt related to the edema, with main differential consideration of viral or atypical respiratory infection. 3. Calcified coronary artery and aortic atherosclerosis. Electronically Signed   By: Genevie Ann M.D.   On: 08/30/2015 16:31     Management plans discussed with the patient, family and they are in agreement.  CODE STATUS:     Code Status Orders        Start     Ordered   08/30/15 1700  Full code  Continuous     08/30/15 1701    Code Status History    Date Active Date Inactive Code Status Order ID Comments User Context   08/30/2015  5:01 PM 08/31/2015 12:22 PM Full Code [id number]  Dustin Flock, MD ED   09/18/2014  5:09 PM 09/21/2014  6:28 PM Full Code [id number]  Renette Butters, MD Inpatient   09/17/2014  6:37 PM 09/18/2014  5:09 PM Full Code [id number]  Flonnie Overman Dhungel, MD Inpatient   01/19/2013 12:36 AM 01/21/2013  4:30 PM Full Code [id number]  Etta Quill, DO ED      TOTAL TIME TAKING CARE OF THIS PATIENT: 33 minutes.    Hower,  Karenann Cai.D on 09/01/2015 at 11:39 AM  Between 7am to 6pm - Pager - [phone]  After 6pm go to [email or web address] - [employer]wer [place] [facility]  Office  [phone]  CC: Primary care physician; No PCP Per Patient

## 2015-09-15 ENCOUNTER — Ambulatory Visit: Payer: Medicare Other | Admitting: Family
